# Patient Record
Sex: Male | Born: 1960
Health system: Southern US, Community
[De-identification: ages and names within clinical notes are randomized; demographics above are authoritative.]

## PROBLEM LIST (undated history)

## (undated) DIAGNOSIS — G4730 Sleep apnea, unspecified: Secondary | ICD-10-CM

## (undated) DIAGNOSIS — Z86010 Personal history of colonic polyps: Secondary | ICD-10-CM

## (undated) DIAGNOSIS — H269 Unspecified cataract: Secondary | ICD-10-CM

## (undated) DIAGNOSIS — J439 Emphysema, unspecified: Secondary | ICD-10-CM

## (undated) DIAGNOSIS — E785 Hyperlipidemia, unspecified: Secondary | ICD-10-CM

## (undated) HISTORY — DX: Emphysema, unspecified: J43.9

## (undated) HISTORY — DX: Hyperlipidemia, unspecified: E78.5

## (undated) HISTORY — DX: Sleep apnea, unspecified: G47.30

## (undated) HISTORY — PX: COLONOSCOPY: SHX174

## (undated) HISTORY — DX: Unspecified cataract: H26.9

## (undated) HISTORY — DX: Personal history of colonic polyps: Z86.010

---

## 2001-10-28 ENCOUNTER — Encounter: Payer: Self-pay | Admitting: Emergency Medicine

## 2001-10-28 ENCOUNTER — Encounter: Admission: RE | Admit: 2001-10-28 | Discharge: 2001-10-28 | Payer: Self-pay | Admitting: Emergency Medicine

## 2004-11-10 ENCOUNTER — Encounter: Admission: RE | Admit: 2004-11-10 | Discharge: 2004-11-10 | Payer: Self-pay | Admitting: Emergency Medicine

## 2012-02-14 ENCOUNTER — Encounter: Payer: Self-pay | Admitting: Internal Medicine

## 2012-03-28 ENCOUNTER — Ambulatory Visit (AMBULATORY_SURGERY_CENTER): Payer: BC Managed Care – PPO | Admitting: *Deleted

## 2012-03-28 ENCOUNTER — Encounter: Payer: Self-pay | Admitting: Internal Medicine

## 2012-03-28 VITALS — Ht 73.0 in | Wt 160.0 lb

## 2012-03-28 DIAGNOSIS — Z1211 Encounter for screening for malignant neoplasm of colon: Secondary | ICD-10-CM

## 2012-03-28 MED ORDER — SUPREP BOWEL PREP KIT 17.5-3.13-1.6 GM/177ML PO SOLN: ORAL | Status: DC

## 2012-04-11 ENCOUNTER — Encounter: Payer: Self-pay | Admitting: Internal Medicine

## 2012-04-11 ENCOUNTER — Ambulatory Visit (AMBULATORY_SURGERY_CENTER): Payer: BC Managed Care – PPO | Admitting: Internal Medicine

## 2012-04-11 VITALS — BP 123/77 | HR 46 | Temp 98.0°F | Resp 12 | Ht 73.0 in | Wt 160.0 lb

## 2012-04-11 DIAGNOSIS — D128 Benign neoplasm of rectum: Secondary | ICD-10-CM

## 2012-04-11 DIAGNOSIS — D126 Benign neoplasm of colon, unspecified: Secondary | ICD-10-CM

## 2012-04-11 DIAGNOSIS — Z1211 Encounter for screening for malignant neoplasm of colon: Secondary | ICD-10-CM

## 2012-04-11 DIAGNOSIS — D129 Benign neoplasm of anus and anal canal: Secondary | ICD-10-CM

## 2012-04-11 MED ORDER — SODIUM CHLORIDE 0.9 % IV SOLN: 500.0000 mL | INTRAVENOUS | Status: DC

## 2012-04-11 NOTE — Progress Notes (Signed)
The pt tolerated the colonoscopy very well. Maw   

## 2012-04-11 NOTE — Op Note (Signed)
[facility] [address] [place], [address]   COLONOSCOPY PROCEDURE REPORT  PATIENT: Travis, Woods  MR#: [id number] BIRTHDATE: 1961/04/20 , 51  yrs. old GENDER: Male ENDOSCOPIST: Iva Boop, MD, [facility] REFERRED XB:[id number] Felipa Eth, M.D. PROCEDURE DATE:  04/11/2012 PROCEDURE:   Colonoscopy with snare polypectomy ASA CLASS:   Class I INDICATIONS:average risk screening. MEDICATIONS: Propofol (Diprivan) 220 mg IV, MAC sedation, administered by CRNA, and These medications were titrated to patient response per physician's verbal order  DESCRIPTION OF PROCEDURE:   After the risks benefits and alternatives of the procedure were thoroughly explained, informed consent was obtained.  A digital rectal exam revealed no abnormalities of the rectum and A digital rectal exam revealed the prostate was not enlarged.   The LB [id number]  endoscope was introduced through the anus and advanced to the cecum, which was identified by both the appendix and ileocecal valve. No adverse events experienced.   The quality of the prep was Suprep excellent The instrument was then slowly withdrawn as the colon was fully examined.      COLON FINDINGS: Three polypoid shaped sessile polyps measuring 4-12 mm in size were found in the descending colon, sigmoid colon, and rectum.  A polypectomy was performed with a cold snare and using snare cautery.  The resection was complete and the polyp tissue was completely retrieved.   The colon mucosa was otherwise normal. Retroflexed views revealed no abnormalities. The time to cecum=7 minutes 19 seconds.  Withdrawal time=13 minutes 56 seconds.  The scope was withdrawn and the procedure completed. COMPLICATIONS: There were no complications.  ENDOSCOPIC IMPRESSION: 1.   Three sessile polyps measuring 4-12 mm in size were found in the descending colon, sigmoid colon, and rectum; polypectomy was performed with a cold snare and using snare  cautery 2.   The colon mucosa was otherwise normal with excellent prep  RECOMMENDATIONS: 1.  Hold aspirin, aspirin products, and anti-inflammatory medication for 2 weeks. 2.  Timing of repeat colonoscopy will be determined by pathology findings.   eSigned:  Iva Boop, MD, [facility] 04/11/2012 9:26 AM cc: Chilton Greathouse, MD and The Patient

## 2012-04-11 NOTE — Progress Notes (Signed)
Patient did not experience any of the following events: a burn prior to discharge; a fall within the facility; wrong site/side/patient/procedure/implant event; or a hospital transfer or hospital admission upon discharge from the facility. (G8907) Patient did not have preoperative order for IV antibiotic SSI prophylaxis. (G8918)  

## 2012-04-11 NOTE — Patient Instructions (Addendum)
Three polyps were removed. They look benign.  I will let you know about the pathology results and recommendation for next routine colonoscopy. I think it will be to have one in 3 years.  Thank you for choosing me and [facility].  Iva Boop, MD, FACG  YOU HAD AN ENDOSCOPIC PROCEDURE TODAY AT THE [facility]: Refer to the procedure report that was given to you for any specific questions about what was found during the examination.  If the procedure report does not answer your questions, please call your gastroenterologist to clarify.  If you requested that your care partner not be given the details of your procedure findings, then the procedure report has been included in a sealed envelope for you to review at your convenience later.  YOU SHOULD EXPECT: Some feelings of bloating in the abdomen. Passage of more gas than usual.  Walking can help get rid of the air that was put into your GI tract during the procedure and reduce the bloating. If you had a lower endoscopy (such as a colonoscopy or flexible sigmoidoscopy) you may notice spotting of blood in your stool or on the toilet paper. If you underwent a bowel prep for your procedure, then you may not have a normal bowel movement for a few days.  DIET: Your first meal following the procedure should be a light meal and then it is ok to progress to your normal diet.  A half-sandwich or bowl of soup is an example of a good first meal.  Heavy or fried foods are harder to digest and may make you feel nauseous or bloated.  Likewise meals heavy in dairy and vegetables can cause extra gas to form and this can also increase the bloating.  Drink plenty of fluids but you should avoid alcoholic beverages for 24 hours.  ACTIVITY: Your care partner should take you home directly after the procedure.  You should plan to take it easy, moving slowly for the rest of the day.  You can resume normal activity the day after the procedure  however you should NOT DRIVE or use heavy machinery for 24 hours (because of the sedation medicines used during the test).    SYMPTOMS TO REPORT IMMEDIATELY: A gastroenterologist can be reached at any hour.  During normal business hours, 8:30 AM to 5:00 PM Monday through Friday, call [phone].  After hours and on weekends, please call the GI answering service at [phone] who will take a message and have the physician on call contact you.   Following lower endoscopy (colonoscopy or flexible sigmoidoscopy):  Excessive amounts of blood in the stool  Significant tenderness or worsening of abdominal pains  Swelling of the abdomen that is new, acute  Fever of 100F or higher  FOLLOW UP: If any biopsies were taken you will be contacted by phone or by letter within the next 1-3 weeks.  Call your gastroenterologist if you have not heard about the biopsies in 3 weeks.  Our staff will call the home number listed on your records the next business day following your procedure to check on you and address any questions or concerns that you may have at that time regarding the information given to you following your procedure. This is a courtesy call and so if there is no answer at the home number and we have not heard from you through the emergency physician on call, we will assume that you have returned to your regular daily activities without incident.  SIGNATURES/CONFIDENTIALITY: You and/or your care partner have signed paperwork which will be entered into your electronic medical record.  These signatures attest to the fact that that the information above on your After Visit Summary has been reviewed and is understood.  Full responsibility of the confidentiality of this discharge information lies with you and/or your care-partner.    Handouts on polyps  Hold aspirin, aspirin containing products, and all anti inflammatory medicines for 2 weeks-no motrin, aleve, advil for 2 weeks  Tylenol only for  pain May resume these medicines 04-25-2012

## 2012-04-14 ENCOUNTER — Telehealth: Payer: Self-pay | Admitting: *Deleted

## 2012-04-14 NOTE — Telephone Encounter (Signed)
  Follow up Call-  Call back number 04/11/2012  Post procedure Call Back phone  # [phone]  Permission to leave phone message Yes     Patient questions:  Do you have a fever, pain , or abdominal swelling? no Pain Score  0 *  Have you tolerated food without any problems? yes  Have you been able to return to your normal activities? yes  Do you have any questions about your discharge instructions: Diet   no Medications  no Follow up visit  no  Do you have questions or concerns about your Care? no  Actions: * If pain score is 4 or above: No action needed, pain <4.

## 2012-04-16 ENCOUNTER — Encounter: Payer: Self-pay | Admitting: Internal Medicine

## 2012-04-16 DIAGNOSIS — Z860100 Personal history of colon polyps, unspecified: Secondary | ICD-10-CM | POA: Insufficient documentation

## 2012-04-16 HISTORY — DX: Personal history of colonic polyps: Z86.010

## 2012-04-16 NOTE — Progress Notes (Signed)
Quick Note:  2 of 3 polyps adenomas, 1 hyperplastic Repeat colon 04/2015 ______

## 2015-05-30 ENCOUNTER — Encounter: Payer: Self-pay | Admitting: Internal Medicine

## 2015-08-12 ENCOUNTER — Ambulatory Visit (AMBULATORY_SURGERY_CENTER): Payer: Self-pay | Admitting: *Deleted

## 2015-08-12 VITALS — Ht 72.0 in | Wt 174.4 lb

## 2015-08-12 DIAGNOSIS — Z86010 Personal history of colonic polyps: Secondary | ICD-10-CM

## 2015-08-12 NOTE — Progress Notes (Signed)
No allergies to eggs or soy. No prior anesthesia; no problems with sedation with previous colonoscopy  Pt given Emmi instructions for colonoscopy  No oxygen use  No diet drug use

## 2015-08-26 ENCOUNTER — Encounter: Payer: Self-pay | Admitting: Internal Medicine

## 2015-08-26 ENCOUNTER — Ambulatory Visit (AMBULATORY_SURGERY_CENTER): Payer: BLUE CROSS/BLUE SHIELD | Admitting: Internal Medicine

## 2015-08-26 VITALS — BP 116/81 | HR 61 | Temp 95.3°F | Resp 17 | Ht 72.0 in | Wt 174.0 lb

## 2015-08-26 DIAGNOSIS — Z86010 Personal history of colonic polyps: Secondary | ICD-10-CM

## 2015-08-26 DIAGNOSIS — D125 Benign neoplasm of sigmoid colon: Secondary | ICD-10-CM | POA: Diagnosis not present

## 2015-08-26 DIAGNOSIS — K621 Rectal polyp: Secondary | ICD-10-CM

## 2015-08-26 DIAGNOSIS — M329 Systemic lupus erythematosus, unspecified: Secondary | ICD-10-CM | POA: Insufficient documentation

## 2015-08-26 MED ORDER — SODIUM CHLORIDE 0.9 % IV SOLN: 500.0000 mL | INTRAVENOUS | Status: DC

## 2015-08-26 NOTE — Op Note (Signed)
[facility]  [address] [place], [address]   COLONOSCOPY PROCEDURE REPORT  PATIENT: Travis Woods, Travis Woods  MR#: [id number] BIRTHDATE: Sep 04, 1960 , 17  yrs. old GENDER: male ENDOSCOPIST: Gatha Mayer, MD, [facility] PROCEDURE DATE:  08/26/2015 PROCEDURE:   Colonoscopy, surveillance and Colonoscopy with snare polypectomy First Screening Colonoscopy - Avg.  risk and is 50 yrs.  old or older - No.  Prior Negative Screening - Now for repeat screening. N/A  History of Adenoma - Now for follow-up colonoscopy & has been > or = to 3 yrs.  Yes hx of adenoma.  Has been 3 or more years since last colonoscopy.  Polyps removed today? Yes ASA CLASS:   Class II INDICATIONS:Surveillance due to prior colonic neoplasia and PH Colon Adenoma. MEDICATIONS: Propofol 300 mg IV, Monitored anesthesia care, and Lidocaine 40 mg IV  DESCRIPTION OF PROCEDURE:   After the risks benefits and alternatives of the procedure were thoroughly explained, informed consent was obtained.  The digital rectal exam revealed no abnormalities of the rectum, revealed no prostatic nodules, and revealed the prostate was not enlarged.   The LB [id number] endoscope was introduced through the anus and advanced to the cecum, which was identified by both the appendix and ileocecal valve. No adverse events experienced.   The quality of the prep was excellent.  (MiraLax was used)  The instrument was then slowly withdrawn as the colon was fully examined. Estimated blood loss is zero unless otherwise noted in this procedure report.      COLON FINDINGS: A sessile polyp measuring 3 mm in size was found in the sigmoid colon.  A polypectomy was performed with a cold snare. The resection was complete but the polyp tissue was not retrieved. The examination was otherwise normal.  Retroflexed views revealed no abnormalities. The time to cecum = 7.4 Withdrawal time = 9.1 The scope was withdrawn and the procedure  completed. COMPLICATIONS: There were no immediate complications.  ENDOSCOPIC IMPRESSION: 1.   Sessile polyp was found in the sigmoid colon; polypectomy was performed with a cold snare 2.   The examination was otherwise normal - excellent prep  RECOMMENDATIONS: Repeat Colonoscopy in 5 years.  2022 - he has hx adenomas x 2 max 12 mm 2013  eSigned:  Gatha Mayer, MD, [facility] 08/26/2015 10:58 AM   cc: Dr. Berneta Sages and The Patient

## 2015-08-26 NOTE — Patient Instructions (Addendum)
I removed one tiny polyp - it was not collected but it was tiny and I feel certain it was benign.  Your next routine colonoscopy should be in 5 years - 2022.  I appreciate the opportunity to care for you. Gatha Mayer, MD, FACG  YOU HAD AN ENDOSCOPIC PROCEDURE TODAY AT [facility]:   Refer to the procedure report that was given to you for any specific questions about what was found during the examination.  If the procedure report does not answer your questions, please call your gastroenterologist to clarify.  If you requested that your care partner not be given the details of your procedure findings, then the procedure report has been included in a sealed envelope for you to review at your convenience later.  YOU SHOULD EXPECT: Some feelings of bloating in the abdomen. Passage of more gas than usual.  Walking can help get rid of the air that was put into your GI tract during the procedure and reduce the bloating. If you had a lower endoscopy (such as a colonoscopy or flexible sigmoidoscopy) you may notice spotting of blood in your stool or on the toilet paper. If you underwent a bowel prep for your procedure, you may not have a normal bowel movement for a few days.  Please Note:  You might notice some irritation and congestion in your nose or some drainage.  This is from the oxygen used during your procedure.  There is no need for concern and it should clear up in a day or so.  SYMPTOMS TO REPORT IMMEDIATELY:   Following lower endoscopy (colonoscopy or flexible sigmoidoscopy):  Excessive amounts of blood in the stool  Significant tenderness or worsening of abdominal pains  Swelling of the abdomen that is new, acute  Fever of 100F or higher  For urgent or emergent issues, a gastroenterologist can be reached at any hour by calling [phone].   DIET: Your first meal following the procedure should be a small meal and then it is ok to progress to your normal  diet. Heavy or fried foods are harder to digest and may make you feel nauseous or bloated.  Likewise, meals heavy in dairy and vegetables can increase bloating.  Drink plenty of fluids but you should avoid alcoholic beverages for 24 hours.  ACTIVITY:  You should plan to take it easy for the rest of today and you should NOT DRIVE or use heavy machinery until tomorrow (because of the sedation medicines used during the test).    FOLLOW UP: Our staff will call the number listed on your records the next business day following your procedure to check on you and address any questions or concerns that you may have regarding the information given to you following your procedure. If we do not reach you, we will leave a message.  However, if you are feeling well and you are not experiencing any problems, there is no need to return our call.  We will assume that you have returned to your regular daily activities without incident.  If any biopsies were taken you will be contacted by phone or by letter within the next 1-3 weeks.  Please call us at [phone] if you have not heard about the biopsies in 3 weeks.    SIGNATURES/CONFIDENTIALITY: You and/or your care partner have signed paperwork which will be entered into your electronic medical record.  These signatures attest to the fact that that the information above on your  After Visit Summary has been reviewed and is understood.  Full responsibility of the confidentiality of this discharge information lies with you and/or your care-partner.

## 2015-08-26 NOTE — Progress Notes (Signed)
Stable to RR 

## 2015-08-26 NOTE — Progress Notes (Signed)
Called to room to assist during endoscopic procedure.  Patient ID and intended procedure confirmed with present staff. Received instructions for my participation in the procedure from the performing physician.  

## 2015-08-29 ENCOUNTER — Telehealth: Payer: Self-pay | Admitting: *Deleted

## 2015-08-29 NOTE — Telephone Encounter (Signed)
No answer. No identifier. Message left to call if questions or concerns. 

## 2015-11-10 DIAGNOSIS — Z125 Encounter for screening for malignant neoplasm of prostate: Secondary | ICD-10-CM | POA: Diagnosis not present

## 2015-11-10 DIAGNOSIS — Z0000 Encounter for general adult medical examination without abnormal findings: Secondary | ICD-10-CM | POA: Diagnosis not present

## 2015-11-15 DIAGNOSIS — M546 Pain in thoracic spine: Secondary | ICD-10-CM | POA: Diagnosis not present

## 2015-11-15 DIAGNOSIS — L930 Discoid lupus erythematosus: Secondary | ICD-10-CM | POA: Diagnosis not present

## 2015-11-15 DIAGNOSIS — Z125 Encounter for screening for malignant neoplasm of prostate: Secondary | ICD-10-CM | POA: Diagnosis not present

## 2015-11-15 DIAGNOSIS — E784 Other hyperlipidemia: Secondary | ICD-10-CM | POA: Diagnosis not present

## 2015-11-15 DIAGNOSIS — Z0000 Encounter for general adult medical examination without abnormal findings: Secondary | ICD-10-CM | POA: Diagnosis not present

## 2015-11-15 DIAGNOSIS — K635 Polyp of colon: Secondary | ICD-10-CM | POA: Diagnosis not present

## 2015-11-15 DIAGNOSIS — Z1389 Encounter for screening for other disorder: Secondary | ICD-10-CM | POA: Diagnosis not present

## 2015-11-17 DIAGNOSIS — Z1212 Encounter for screening for malignant neoplasm of rectum: Secondary | ICD-10-CM | POA: Diagnosis not present

## 2016-11-22 DIAGNOSIS — E784 Other hyperlipidemia: Secondary | ICD-10-CM | POA: Diagnosis not present

## 2016-11-22 DIAGNOSIS — Z125 Encounter for screening for malignant neoplasm of prostate: Secondary | ICD-10-CM | POA: Diagnosis not present

## 2016-11-27 DIAGNOSIS — Z0000 Encounter for general adult medical examination without abnormal findings: Secondary | ICD-10-CM | POA: Diagnosis not present

## 2016-11-27 DIAGNOSIS — M546 Pain in thoracic spine: Secondary | ICD-10-CM | POA: Diagnosis not present

## 2016-11-27 DIAGNOSIS — L930 Discoid lupus erythematosus: Secondary | ICD-10-CM | POA: Diagnosis not present

## 2016-11-27 DIAGNOSIS — K635 Polyp of colon: Secondary | ICD-10-CM | POA: Diagnosis not present

## 2016-11-27 DIAGNOSIS — F17200 Nicotine dependence, unspecified, uncomplicated: Secondary | ICD-10-CM | POA: Diagnosis not present

## 2016-12-05 DIAGNOSIS — F17200 Nicotine dependence, unspecified, uncomplicated: Secondary | ICD-10-CM | POA: Diagnosis not present

## 2016-12-05 DIAGNOSIS — J209 Acute bronchitis, unspecified: Secondary | ICD-10-CM | POA: Diagnosis not present

## 2016-12-05 DIAGNOSIS — R05 Cough: Secondary | ICD-10-CM | POA: Diagnosis not present

## 2016-12-05 DIAGNOSIS — Z6823 Body mass index (BMI) 23.0-23.9, adult: Secondary | ICD-10-CM | POA: Diagnosis not present

## 2017-12-25 DIAGNOSIS — E7849 Other hyperlipidemia: Secondary | ICD-10-CM | POA: Diagnosis not present

## 2017-12-25 DIAGNOSIS — Z125 Encounter for screening for malignant neoplasm of prostate: Secondary | ICD-10-CM | POA: Diagnosis not present

## 2018-01-01 DIAGNOSIS — Z0000 Encounter for general adult medical examination without abnormal findings: Secondary | ICD-10-CM | POA: Diagnosis not present

## 2018-01-01 DIAGNOSIS — K635 Polyp of colon: Secondary | ICD-10-CM | POA: Diagnosis not present

## 2018-01-01 DIAGNOSIS — E7849 Other hyperlipidemia: Secondary | ICD-10-CM | POA: Diagnosis not present

## 2018-01-01 DIAGNOSIS — Z125 Encounter for screening for malignant neoplasm of prostate: Secondary | ICD-10-CM | POA: Diagnosis not present

## 2018-01-01 DIAGNOSIS — Z1389 Encounter for screening for other disorder: Secondary | ICD-10-CM | POA: Diagnosis not present

## 2018-01-01 DIAGNOSIS — F17200 Nicotine dependence, unspecified, uncomplicated: Secondary | ICD-10-CM | POA: Diagnosis not present

## 2018-01-01 DIAGNOSIS — H6123 Impacted cerumen, bilateral: Secondary | ICD-10-CM | POA: Diagnosis not present

## 2018-01-06 DIAGNOSIS — Z1212 Encounter for screening for malignant neoplasm of rectum: Secondary | ICD-10-CM | POA: Diagnosis not present

## 2018-04-04 DIAGNOSIS — Z6823 Body mass index (BMI) 23.0-23.9, adult: Secondary | ICD-10-CM | POA: Diagnosis not present

## 2018-04-04 DIAGNOSIS — R222 Localized swelling, mass and lump, trunk: Secondary | ICD-10-CM | POA: Diagnosis not present

## 2018-04-04 DIAGNOSIS — F17200 Nicotine dependence, unspecified, uncomplicated: Secondary | ICD-10-CM | POA: Diagnosis not present

## 2018-04-23 DIAGNOSIS — Z6823 Body mass index (BMI) 23.0-23.9, adult: Secondary | ICD-10-CM | POA: Diagnosis not present

## 2018-04-23 DIAGNOSIS — L0889 Other specified local infections of the skin and subcutaneous tissue: Secondary | ICD-10-CM | POA: Diagnosis not present

## 2018-04-23 DIAGNOSIS — F17200 Nicotine dependence, unspecified, uncomplicated: Secondary | ICD-10-CM | POA: Diagnosis not present

## 2018-06-01 ENCOUNTER — Encounter (HOSPITAL_COMMUNITY): Payer: Self-pay | Admitting: Emergency Medicine

## 2018-06-01 ENCOUNTER — Emergency Department (HOSPITAL_COMMUNITY): Payer: BLUE CROSS/BLUE SHIELD

## 2018-06-01 ENCOUNTER — Encounter (HOSPITAL_COMMUNITY): Payer: Self-pay | Admitting: *Deleted

## 2018-06-01 ENCOUNTER — Ambulatory Visit (HOSPITAL_COMMUNITY)
Admission: EM | Admit: 2018-06-01 | Discharge: 2018-06-01 | Disposition: A | Discharge status: Home / Self Care | Payer: BLUE CROSS/BLUE SHIELD | Attending: Family Medicine | Admitting: Family Medicine

## 2018-06-01 ENCOUNTER — Emergency Department (HOSPITAL_COMMUNITY)
Admission: EM | Admit: 2018-06-01 | Discharge: 2018-06-01 | Disposition: A | Discharge status: Home / Self Care | Payer: BLUE CROSS/BLUE SHIELD | Attending: Emergency Medicine | Admitting: Emergency Medicine

## 2018-06-01 ENCOUNTER — Ambulatory Visit (INDEPENDENT_AMBULATORY_CARE_PROVIDER_SITE_OTHER): Payer: BLUE CROSS/BLUE SHIELD

## 2018-06-01 DIAGNOSIS — M546 Pain in thoracic spine: Secondary | ICD-10-CM

## 2018-06-01 DIAGNOSIS — R918 Other nonspecific abnormal finding of lung field: Secondary | ICD-10-CM | POA: Diagnosis not present

## 2018-06-01 DIAGNOSIS — R0600 Dyspnea, unspecified: Secondary | ICD-10-CM | POA: Diagnosis not present

## 2018-06-01 DIAGNOSIS — R079 Chest pain, unspecified: Secondary | ICD-10-CM

## 2018-06-01 DIAGNOSIS — F17210 Nicotine dependence, cigarettes, uncomplicated: Secondary | ICD-10-CM | POA: Diagnosis not present

## 2018-06-01 DIAGNOSIS — R0781 Pleurodynia: Secondary | ICD-10-CM | POA: Diagnosis not present

## 2018-06-01 DIAGNOSIS — R7989 Other specified abnormal findings of blood chemistry: Secondary | ICD-10-CM | POA: Diagnosis not present

## 2018-06-01 DIAGNOSIS — Z79899 Other long term (current) drug therapy: Secondary | ICD-10-CM | POA: Diagnosis not present

## 2018-06-01 DIAGNOSIS — R52 Pain, unspecified: Secondary | ICD-10-CM

## 2018-06-01 LAB — D-DIMER, QUANTITATIVE (NOT AT ARMC): D DIMER QUANT: 0.45 ug{FEU}/mL (ref 0.00–0.50)

## 2018-06-01 LAB — CBC WITH DIFFERENTIAL/PLATELET
Abs Immature Granulocytes: 0.02 10*3/uL (ref 0.00–0.07)
Basophils Absolute: 0 10*3/uL (ref 0.0–0.1)
Basophils Relative: 1 %
Eosinophils Absolute: 0.2 10*3/uL (ref 0.0–0.5)
Eosinophils Relative: 3 %
HEMATOCRIT: 44.7 % (ref 39.0–52.0)
Hemoglobin: 14.3 g/dL (ref 13.0–17.0)
Immature Granulocytes: 0 %
Lymphocytes Relative: 34 %
Lymphs Abs: 2 10*3/uL (ref 0.7–4.0)
MCH: 28.4 pg (ref 26.0–34.0)
MCHC: 32 g/dL (ref 30.0–36.0)
MCV: 88.7 fL (ref 80.0–100.0)
Monocytes Absolute: 0.4 10*3/uL (ref 0.1–1.0)
Monocytes Relative: 7 %
NEUTROS ABS: 3.2 10*3/uL (ref 1.7–7.7)
Neutrophils Relative %: 55 %
Platelets: 185 10*3/uL (ref 150–400)
RBC: 5.04 MIL/uL (ref 4.22–5.81)
RDW: 15.3 % (ref 11.5–15.5)
WBC: 5.8 10*3/uL (ref 4.0–10.5)
nRBC: 0 % (ref 0.0–0.2)

## 2018-06-01 LAB — BASIC METABOLIC PANEL
Anion gap: 8 (ref 5–15)
BUN: 13 mg/dL (ref 6–20)
CO2: 25 mmol/L (ref 22–32)
Calcium: 9.4 mg/dL (ref 8.9–10.3)
Chloride: 104 mmol/L (ref 98–111)
Creatinine, Ser: 0.89 mg/dL (ref 0.61–1.24)
GFR calc Af Amer: >60 mL/min (ref >60)
GFR calc non Af Amer: >60 mL/min (ref >60)
Glucose, Bld: 93 mg/dL (ref 70–99)
POTASSIUM: 3.9 mmol/L (ref 3.5–5.1)
Sodium: 137 mmol/L (ref 135–145)

## 2018-06-01 LAB — I-STAT TROPONIN, ED: Troponin i, poc: 0.01 ng/mL (ref 0.00–0.08)

## 2018-06-01 MED ORDER — IBUPROFEN 400 MG PO TABS
600.0000 mg | ORAL_TABLET | Freq: Once | ORAL | Status: AC
  Administered 2018-06-01: 600 mg via ORAL
  Filled 2018-06-01: qty 1

## 2018-06-01 MED ORDER — HYDROCODONE-ACETAMINOPHEN 5-325 MG PO TABS: 1.0000 | ORAL_TABLET | Freq: Four times a day (QID) | ORAL | 0 refills | Status: AC | PRN

## 2018-06-01 MED ORDER — IOPAMIDOL (ISOVUE-370) INJECTION 76%
INTRAVENOUS | Status: AC
  Filled 2018-06-01: qty 100

## 2018-06-01 MED ORDER — IOPAMIDOL (ISOVUE-370) INJECTION 76%
100.0000 mL | Freq: Once | INTRAVENOUS | Status: AC | PRN
  Administered 2018-06-01: 100 mL via INTRAVENOUS

## 2018-06-01 MED ORDER — METHOCARBAMOL 500 MG PO TABS
1000.0000 mg | ORAL_TABLET | Freq: Once | ORAL | Status: AC
  Administered 2018-06-01: 1000 mg via ORAL
  Filled 2018-06-01: qty 2

## 2018-06-01 MED ORDER — LIDOCAINE 5 % EX PTCH: 1.0000 | MEDICATED_PATCH | CUTANEOUS | 0 refills | Status: AC

## 2018-06-01 MED ORDER — METHOCARBAMOL 500 MG PO TABS: 500.0000 mg | ORAL_TABLET | Freq: Two times a day (BID) | ORAL | 0 refills | Status: AC

## 2018-06-01 NOTE — Discharge Instructions (Addendum)
I would like for you to go to the ER for further evaluation of your chest pain

## 2018-06-01 NOTE — ED Triage Notes (Signed)
Denies injury.  C/O left lateral mid-ribcage tenderness, pain with deep breathing and movement.

## 2018-06-01 NOTE — ED Provider Notes (Signed)
[facility] EMERGENCY DEPARTMENT Provider Note   CSN: [id number] Arrival date & time: 06/01/18  1353     History   Chief Complaint Chief Complaint  Patient presents with  . Chest Pain    HPI Travis Woods is a 57 y.o. male.  HPI   Travis Woods is a 57 y.o. male, with a history of cutaneous lupus, presenting to the ED with left upper back pain beginning this morning upon waking.  Pain has been worsening throughout the day and intensifies with deep breathing, "feels like someone is sticking a knife in me," 7/10, radiating toward the left axillary region. Patient was seen at urgent care and then sent to the ED due to concern for EKG abnormalities. Denies cough, chest pain, abdominal pain, trauma, diaphoresis, dizziness, N/V/D, lower extremity edema/pain, or any other complaints.   Past Medical History:  Diagnosis Date  . Personal history of adenomatous colonic polyps 04/16/2012   04/2012 - 3 polyps, 2 adenomas largest 12 mm and 1 hyperplastic     Patient Active Problem List   Diagnosis Date Noted  . Systemic lupus erythematosus ([place]) 08/26/2015  . Personal history of adenomatous colonic polyps 04/16/2012    Past Surgical History:  Procedure Laterality Date  . COLONOSCOPY          Home Medications    Prior to Admission medications   Medication Sig Start Date End Date Taking? Authorizing Provider  ibuprofen (ADVIL,MOTRIN) 200 MG tablet Take 400 mg by mouth every 6 (six) hours as needed for moderate pain.   Yes [provider]  Varenicline Tartrate (CHANTIX PO) Take by mouth.   Yes [provider]  HYDROcodone-acetaminophen (NORCO/VICODIN) 5-325 MG tablet Take 1-2 tablets by mouth every 6 (six) hours as needed for severe pain. 06/01/18   Caya Soberanis C, PA-C  lidocaine (LIDODERM) 5 % Place 1 patch onto the skin daily. Remove & Discard patch within 12 hours or as directed by MD 06/01/18   Mirca Yale C, PA-C  methocarbamol (ROBAXIN) 500  MG tablet Take 1 tablet (500 mg total) by mouth 2 (two) times daily. 06/01/18   Aliany Fiorenza, Helane Gunther, PA-C    Family History Family History  Problem Relation Age of Onset  . Prostate cancer Father   . Colon cancer Neg Hx     Social History Social History   Tobacco Use  . Smoking status: Current Every Day Smoker    Types: Cigarettes  . Smokeless tobacco: Never Used  Substance Use Topics  . Alcohol use: No    Alcohol/week: 0.0 standard drinks  . Drug use: Never     Allergies   Patient has no known allergies.   Review of Systems Review of Systems  Constitutional: Negative for chills, diaphoresis and fever.  Respiratory: Negative for shortness of breath.   Cardiovascular: Negative for chest pain and leg swelling.  Gastrointestinal: Negative for abdominal pain, diarrhea, nausea and vomiting.  Musculoskeletal: Positive for back pain. Negative for neck pain.  Neurological: Negative for weakness and numbness.  All other systems reviewed and are negative.    Physical Exam Updated Vital Signs BP 134/87 (BP Location: Right Arm)   Pulse 70   Temp 97.6 F (36.4 C) (Oral)   Resp (!) 22   Ht 6' (1.829 m)   Wt 78.9 kg   SpO2 99%   BMI 23.59 kg/m   Physical Exam  Constitutional: He appears well-developed and well-nourished. No distress.  HENT:  Head: Normocephalic and atraumatic.  Eyes: Conjunctivae are normal.  Neck: Neck supple.  Cardiovascular: Normal rate, regular rhythm, normal heart sounds and intact distal pulses.  Pulmonary/Chest: Effort normal and breath sounds normal. No respiratory distress.  Abdominal: Soft. There is no tenderness. There is no guarding.  Musculoskeletal: He exhibits no edema.       Back:  Tenderness in the region indicated, however, patient states tenderness is only present with deep palpation.  Lymphadenopathy:    He has no cervical adenopathy.  Neurological: He is alert.  Skin: Skin is warm and dry. He is not diaphoretic.  Psychiatric: He has  a normal mood and affect. His behavior is normal.  Nursing note and vitals reviewed.    ED Treatments / Results  Labs (all labs ordered are listed, but only abnormal results are displayed) Labs Reviewed  BASIC METABOLIC PANEL  CBC WITH DIFFERENTIAL/PLATELET  D-DIMER, QUANTITATIVE (NOT AT [facility])  I-STAT TROPONIN, ED    EKG EKG Interpretation  Date/Time:  Sunday June 01 2018 14:02:31 EST Ventricular Rate:  63 PR Interval:    QRS Duration: 139 QT Interval:  410 QTC Calculation: 420 R Axis:   45 Text Interpretation:  Sinus rhythm Ventricular premature complex Borderline prolonged PR interval Nonspecific intraventricular conduction delay Minimal ST elevation, inferior leads Baseline wander in lead(s) V2 No significant change since last tracing Confirmed by Orlie Dakin [phone]) on 06/01/2018 2:12:02 PM   Radiology Dg Chest 1 View  Result Date: 06/01/2018 CLINICAL DATA:  Cough with pleurisy and mid ribcage tenderness. EXAM: CHEST  1 VIEW COMPARISON:  None. FINDINGS: Within the posterior costophrenic sulcus there and indeterminate opacity overlying the posterior elements of the approximate T11 vertebra. Indeterminate. IMPRESSION: 1. Indeterminate opacity in the projection of the lower thoracic vertebra. In a patient that is at increased risk for lung cancer and presents with chronic cough consider further evaluation with CT of the chest. Electronically Signed   By: Kerby Moors M.D.   On: 06/01/2018 15:41   Dg Ribs Unilateral W/chest Left  Result Date: 06/01/2018 CLINICAL DATA:  Hervey Ard sudden onset upper left back/left rib pain with dyspnea. Current smoker. No reported injury. EXAM: LEFT RIBS AND CHEST - 3+ VIEW COMPARISON:  None. FINDINGS: Top-normal heart size. Normal mediastinal contour. No pneumothorax. No pleural effusion. Lungs appear clear, with no acute consolidative airspace disease and no pulmonary edema. No evidence of fracture or focal osseous lesion in the left ribs,  noting nonvisualization of small portions of the lower most ribs on these views. IMPRESSION: No active cardiopulmonary disease. No fracture or focal osseous lesion in the left ribs, see comments for limitations. Electronically Signed   By: Ilona Sorrel M.D.   On: 06/01/2018 13:35   Ct Angio Chest Pe W And/or Wo Contrast  Result Date: 06/01/2018 CLINICAL DATA:  Back pain. Chest pain and abnormal chest x-ray. Evaluate for pulmonary embolism. Positive D-dimer. EXAM: CT ANGIOGRAPHY CHEST WITH CONTRAST TECHNIQUE: Multidetector CT imaging of the chest was performed using the standard protocol during bolus administration of intravenous contrast. Multiplanar CT image reconstructions and MIPs were obtained to evaluate the vascular anatomy. CONTRAST:  13mL ISOVUE-370 IOPAMIDOL (ISOVUE-370) INJECTION 76% COMPARISON:  Chest x-ray 06/01/2018 FINDINGS: Cardiovascular: Borderline cardiomegaly. Minimal calcified plaque over the left anterior descending coronary artery. Thoracic aorta is unremarkable. Pulmonary arterial system is normal without emboli. Mediastinum/Nodes: No evidence of mediastinal or hilar adenopathy. Remaining mediastinal structures are within normal. Lungs/Pleura: Lungs are well inflated without focal airspace consolidation or effusion. 2-3 mm subpleural nodule over the posterior  right upper lobe. Small amount of aspirate material over the posterior mid trachea. Remaining airways are unremarkable. Upper Abdomen: No acute findings. Musculoskeletal: Mild degenerative change of the spine. Review of the MIP images confirms the above findings. IMPRESSION: No acute cardiopulmonary disease and no evidence of pulmonary embolism. Small amount of aspirate material over the dependent portion of the mid trachea. 2-3 mm subpleural nodule over the posterolateral right upper lobe. Recommend follow-up noncontrast chest CT 1 year. This recommendation follows the consensus statement: Guidelines for Management of Small  Pulmonary Nodules Detected on CT Scans: A Statement from the [place] as published in Radiology 2005; 237:395-400. Online at: https://www.arnold.com/. Mild atherosclerotic coronary artery disease. Borderline cardiomegaly. Electronically Signed   By: Marin Olp M.D.   On: 06/01/2018 16:40    Procedures Procedures (including critical care time)  Medications Ordered in ED Medications  iopamidol (ISOVUE-370) 76 % injection (has no administration in time range)  methocarbamol (ROBAXIN) tablet 1,000 mg (1,000 mg Oral Given 06/01/18 1533)  ibuprofen (ADVIL,MOTRIN) tablet 600 mg (600 mg Oral Given 06/01/18 1533)  iopamidol (ISOVUE-370) 76 % injection 100 mL (100 mLs Intravenous Contrast Given 06/01/18 1610)     Initial Impression / Assessment and Plan / ED Course  I have reviewed the triage vital signs and the nursing notes.  Pertinent labs & imaging results that were available during my care of the patient were reviewed by me and considered in my medical decision making (see chart for details).     Patient presents with left thoracic back pain. Patient is nontoxic appearing, afebrile, not tachycardic, not tachypneic, not hypotensive, maintains excellent SPO2 on room air, and is in no apparent distress.  Troponin and d-dimer negative.  Lung nodule identified CT.  This was communicated with the patient as well as the follow-up imaging recommendations. Suspect patient pain is muscular. The patient was given instructions for home care as well as return precautions. Patient voices understanding of these instructions, accepts the plan, and is comfortable with discharge.  Vitals:   06/01/18 1500 06/01/18 1530 06/01/18 1545 06/01/18 1600  BP: 128/75 113/82 132/79 110/87  Pulse: 64 (!) 59 62 73  Resp: (!) 21 16 17  (!) 26  Temp:      TempSrc:      SpO2: 98% 98% 99% 99%  Weight:      Height:         Final Clinical Impressions(s) / ED Diagnoses   Final  diagnoses:  Acute left-sided thoracic back pain    ED Discharge Orders         Ordered    methocarbamol (ROBAXIN) 500 MG tablet  2 times daily     06/01/18 1659    lidocaine (LIDODERM) 5 %  Every 24 hours     06/01/18 1659    HYDROcodone-acetaminophen (NORCO/VICODIN) 5-325 MG tablet  Every 6 hours PRN     06/01/18 1659           Lorayne Bender, PA-C 06/01/18 1712    Orlie Dakin, MD 06/01/18 1733

## 2018-06-01 NOTE — ED Notes (Signed)
Patient transported to CT 

## 2018-06-01 NOTE — ED Provider Notes (Signed)
[facility]    CSN: [id number] Arrival date & time: 06/01/18  1158     History   Chief Complaint Chief Complaint  Patient presents with  . Chest Pain    HPI Travis Woods is a 57 y.o. male.   Patient is a 58 year old male with history of lupus that presents with sudden onset of left lateral rib pain and trouble taking a deep breath that started this morning.  He denies any heavy lifting, strenuous exercise, falls or injuries.  His symptoms have been constant and remain the same.  He has not take anything for his symptoms.  He denies any recent URI symptoms or fevers.  Patient is a current everyday smoker.  He denies any chest pain but some mild shortness of breath. Pt guarding.   ROS per HPI    Chest Pain    Past Medical History:  Diagnosis Date  . Personal history of adenomatous colonic polyps 04/16/2012   04/2012 - 3 polyps, 2 adenomas largest 12 mm and 1 hyperplastic     Patient Active Problem List   Diagnosis Date Noted  . Systemic lupus erythematosus ([place]) 08/26/2015  . Personal history of adenomatous colonic polyps 04/16/2012    Past Surgical History:  Procedure Laterality Date  . COLONOSCOPY         Home Medications    Prior to Admission medications   Medication Sig Start Date End Date Taking? Authorizing Provider  Varenicline Tartrate (CHANTIX PO) Take by mouth.   Yes [provider]    Family History Family History  Problem Relation Age of Onset  . Prostate cancer Father   . Colon cancer Neg Hx     Social History Social History   Tobacco Use  . Smoking status: Current Every Day Smoker    Types: Cigarettes  . Smokeless tobacco: Never Used  Substance Use Topics  . Alcohol use: No    Alcohol/week: 0.0 standard drinks  . Drug use: Never     Allergies   Patient has no known allergies.   Review of Systems Review of Systems  Cardiovascular: Positive for chest pain.     Physical Exam Triage Vital Signs ED  Triage Vitals  Enc Vitals Group     BP 06/01/18 1222 127/79     Pulse Rate 06/01/18 1222 61     Resp 06/01/18 1222 14     Temp 06/01/18 1222 97.6 F (36.4 C)     Temp Source 06/01/18 1222 Oral     SpO2 06/01/18 1222 100 %     Weight --      Height --      Head Circumference --      Peak Flow --      Pain Score 06/01/18 1223 7     Pain Loc --      Pain Edu? --      Excl. in [place]? --    No data found.  Updated Vital Signs BP 127/79   Pulse 61   Temp 97.6 F (36.4 C) (Oral)   Resp 14   SpO2 100%   Visual Acuity Right Eye Distance:   Left Eye Distance:   Bilateral Distance:    Right Eye Near:   Left Eye Near:    Bilateral Near:     Physical Exam  Constitutional: He appears well-developed and well-nourished.  HENT:  Head: Normocephalic.  Neck: Normal range of motion.  Cardiovascular: Normal rate, regular rhythm and normal pulses. Exam reveals no  S3 and no S4.  No murmur heard. Pulmonary/Chest: Effort normal and breath sounds normal.  Mild dyspnea  Musculoskeletal: Normal range of motion.       Right lower leg: Normal.       Left lower leg: Normal.  Mild tenderness to the left lateral rib/chest area. No swelling, bruising, ecchymosis or deformity.   Neurological: He is alert.  Skin: Skin is warm and dry.  Psychiatric: He has a normal mood and affect.  Nursing note and vitals reviewed.    UC Treatments / Results  Labs (all labs ordered are listed, but only abnormal results are displayed) Labs Reviewed - No data to display  EKG None  Radiology Dg Ribs Unilateral W/chest Left  Result Date: 06/01/2018 CLINICAL DATA:  Hervey Ard sudden onset upper left back/left rib pain with dyspnea. Current smoker. No reported injury. EXAM: LEFT RIBS AND CHEST - 3+ VIEW COMPARISON:  None. FINDINGS: Top-normal heart size. Normal mediastinal contour. No pneumothorax. No pleural effusion. Lungs appear clear, with no acute consolidative airspace disease and no pulmonary edema. No  evidence of fracture or focal osseous lesion in the left ribs, noting nonvisualization of small portions of the lower most ribs on these views. IMPRESSION: No active cardiopulmonary disease. No fracture or focal osseous lesion in the left ribs, see comments for limitations. Electronically Signed   By: Ilona Sorrel M.D.   On: 06/01/2018 13:35    Procedures Procedures (including critical care time)  Medications Ordered in UC Medications - No data to display  Initial Impression / Assessment and Plan / UC Course  I have reviewed the triage vital signs and the nursing notes.  Pertinent labs & imaging results that were available during my care of the patient were reviewed by me and considered in my medical decision making (see chart for details).     She is a 57 year old male that comes in with sudden onset of left-sided chest/rib discomfort.  Hurts to take a deep breath. Mild dyspnea The pain is somewhat reproducible He appears anxious.  Vital signs are stable patient is nontoxic EKG revealed sinus rhythm with premature atrial complexes, septal infarct, T wave abnormality There is no EKG to compare this to. Patient is a smoker X-ray not show any abnormalities Patient still very uncomfortable Based on patient appearance, history of smoking, anxious appearance, and EKG abnormalities we will send down to the ER for further evaluation and management, to rule out any ACS or PE.   Final Clinical Impressions(s) / UC Diagnoses   Final diagnoses:  Nonspecific chest pain     Discharge Instructions     I would like for you to go to the ER for further evaluation of your chest pain    ED Prescriptions    None     Controlled Substance Prescriptions [place] Controlled Substance Registry consulted? Not Applicable   Orvan July, NP 06/01/18 1346

## 2018-06-01 NOTE — Discharge Instructions (Addendum)
Take it easy, but do not lay around too much as this may make any stiffness worse.  Antiinflammatory medications: Take 600 mg of ibuprofen every 6 hours or 440 mg (over the counter dose) to 500 mg (prescription dose) of naproxen every 12 hours for the next 3 days. After this time, these medications may be used as needed for pain. Take these medications with food to avoid upset stomach. Choose only one of these medications, do not take them together. Acetaminophen (generic for Tylenol): Should you continue to have additional pain while taking the ibuprofen or naproxen, you may add in acetaminophen as needed. Your daily total maximum amount of acetaminophen from all sources should be limited to 4000mg /day for persons without liver problems, or 2000mg /day for those with liver problems. Vicodin: May take Vicodin (hydrocodone-acetaminophen) as needed for severe pain.  Do not drive or perform other dangerous activities while taking the Vicodin.  Please note that each pill of Vicodin contains 325 mg of acetaminophen (Tylenol) and the above dosage limits apply. Muscle relaxer: Robaxin is a muscle relaxer and may help loosen stiff muscles. Do not take the Robaxin while driving or performing other dangerous activities.  Lidocaine patches: These are available via either prescription or over-the-counter. The over-the-counter option may be more economical one and are likely just as effective. There are multiple over-the-counter brands, such as Salonpas. Exercises: Be sure to perform the attached exercises starting with three times a week and working up to performing them daily. This is an essential part of preventing long term problems.  Follow up: Follow up with a primary care provider for any future management of these complaints. Be sure to follow up within 7-10 days. Return: Return to the ED should symptoms worsen.  For prescription assistance, may try using prescription discount sites or apps, such as  goodrx.com  As discussed, there was a lung nodule noted on the CT scan.  Recommendation is for you to have repeat CT scan in 1 year.

## 2018-06-01 NOTE — ED Triage Notes (Signed)
To ED via private vehicle from urgent car-- c/o back pain- intermittent with inspiration "feels like someone is sticking a knife in me" pain is 7/10-

## 2018-06-01 NOTE — ED Provider Notes (Signed)
Complains of left-sided posterior lateral thoracic pain onset upon awakening this morning.  Pain is also worse with coughing or changing positions.  Improved by remaining still.  Nonexertional.  Seen at urgent care center prior to coming here sent here for further evaluation.  On exam he is in no distress lungs clear to auscultation heart regular rate and rhythm.  He has pain at posterior lateral chest when sitting up from a supine position.  Chest x-ray viewed by me   Orlie Dakin, MD 06/01/18 1723

## 2018-06-01 NOTE — ED Notes (Signed)
ED Provider at bedside. 

## 2018-09-10 DIAGNOSIS — H40013 Open angle with borderline findings, low risk, bilateral: Secondary | ICD-10-CM | POA: Diagnosis not present

## 2018-09-10 DIAGNOSIS — H11153 Pinguecula, bilateral: Secondary | ICD-10-CM | POA: Diagnosis not present

## 2018-09-10 DIAGNOSIS — H1132 Conjunctival hemorrhage, left eye: Secondary | ICD-10-CM | POA: Diagnosis not present

## 2018-09-22 DIAGNOSIS — S39012D Strain of muscle, fascia and tendon of lower back, subsequent encounter: Secondary | ICD-10-CM | POA: Diagnosis not present

## 2018-09-22 DIAGNOSIS — H159 Unspecified disorder of sclera: Secondary | ICD-10-CM | POA: Diagnosis not present

## 2018-09-22 DIAGNOSIS — H6121 Impacted cerumen, right ear: Secondary | ICD-10-CM | POA: Diagnosis not present

## 2019-01-06 DIAGNOSIS — Z0000 Encounter for general adult medical examination without abnormal findings: Secondary | ICD-10-CM | POA: Diagnosis not present

## 2019-01-06 DIAGNOSIS — E7849 Other hyperlipidemia: Secondary | ICD-10-CM | POA: Diagnosis not present

## 2019-01-06 DIAGNOSIS — Z125 Encounter for screening for malignant neoplasm of prostate: Secondary | ICD-10-CM | POA: Diagnosis not present

## 2019-01-13 DIAGNOSIS — F17200 Nicotine dependence, unspecified, uncomplicated: Secondary | ICD-10-CM | POA: Diagnosis not present

## 2019-01-13 DIAGNOSIS — K635 Polyp of colon: Secondary | ICD-10-CM | POA: Diagnosis not present

## 2019-01-13 DIAGNOSIS — E785 Hyperlipidemia, unspecified: Secondary | ICD-10-CM | POA: Diagnosis not present

## 2019-01-13 DIAGNOSIS — M545 Low back pain: Secondary | ICD-10-CM | POA: Diagnosis not present

## 2019-01-13 DIAGNOSIS — Z0000 Encounter for general adult medical examination without abnormal findings: Secondary | ICD-10-CM | POA: Diagnosis not present

## 2019-05-27 ENCOUNTER — Other Ambulatory Visit: Payer: Self-pay | Admitting: Internal Medicine

## 2019-05-27 DIAGNOSIS — R918 Other nonspecific abnormal finding of lung field: Secondary | ICD-10-CM

## 2019-06-02 DIAGNOSIS — R918 Other nonspecific abnormal finding of lung field: Secondary | ICD-10-CM | POA: Diagnosis not present

## 2019-06-02 DIAGNOSIS — F17200 Nicotine dependence, unspecified, uncomplicated: Secondary | ICD-10-CM | POA: Diagnosis not present

## 2019-06-02 DIAGNOSIS — E7849 Other hyperlipidemia: Secondary | ICD-10-CM | POA: Diagnosis not present

## 2019-07-13 ENCOUNTER — Ambulatory Visit
Admission: RE | Admit: 2019-07-13 | Discharge: 2019-07-13 | Disposition: A | Discharge status: Home / Self Care | Payer: BC Managed Care – PPO | Source: Ambulatory Visit | Attending: Internal Medicine | Admitting: Internal Medicine

## 2019-07-13 ENCOUNTER — Other Ambulatory Visit: Payer: Self-pay

## 2019-07-13 DIAGNOSIS — R918 Other nonspecific abnormal finding of lung field: Secondary | ICD-10-CM | POA: Diagnosis not present

## 2019-08-11 DIAGNOSIS — R918 Other nonspecific abnormal finding of lung field: Secondary | ICD-10-CM | POA: Diagnosis not present

## 2019-08-11 DIAGNOSIS — J439 Emphysema, unspecified: Secondary | ICD-10-CM | POA: Diagnosis not present

## 2019-08-11 DIAGNOSIS — I700 Atherosclerosis of aorta: Secondary | ICD-10-CM | POA: Diagnosis not present

## 2019-08-11 DIAGNOSIS — F17200 Nicotine dependence, unspecified, uncomplicated: Secondary | ICD-10-CM | POA: Diagnosis not present

## 2020-02-04 DIAGNOSIS — Z0000 Encounter for general adult medical examination without abnormal findings: Secondary | ICD-10-CM | POA: Diagnosis not present

## 2020-02-04 DIAGNOSIS — E7849 Other hyperlipidemia: Secondary | ICD-10-CM | POA: Diagnosis not present

## 2020-02-04 DIAGNOSIS — Z125 Encounter for screening for malignant neoplasm of prostate: Secondary | ICD-10-CM | POA: Diagnosis not present

## 2020-02-08 DIAGNOSIS — Z0000 Encounter for general adult medical examination without abnormal findings: Secondary | ICD-10-CM | POA: Diagnosis not present

## 2020-02-08 DIAGNOSIS — E785 Hyperlipidemia, unspecified: Secondary | ICD-10-CM | POA: Diagnosis not present

## 2020-04-14 DIAGNOSIS — J069 Acute upper respiratory infection, unspecified: Secondary | ICD-10-CM | POA: Diagnosis not present

## 2020-04-14 DIAGNOSIS — R059 Cough, unspecified: Secondary | ICD-10-CM | POA: Diagnosis not present

## 2020-04-14 DIAGNOSIS — Z1152 Encounter for screening for COVID-19: Secondary | ICD-10-CM | POA: Diagnosis not present

## 2020-04-30 ENCOUNTER — Ambulatory Visit: Payer: BC Managed Care – PPO | Attending: Internal Medicine

## 2020-04-30 DIAGNOSIS — Z23 Encounter for immunization: Secondary | ICD-10-CM

## 2020-04-30 NOTE — Progress Notes (Signed)
   Covid-19 Vaccination Clinic  Name:  Travis Woods    MRN: [id number] DOB: 01/13/1961  04/30/2020  Travis Woods was observed post Covid-19 immunization for 15 minutes without incident. He was provided with Vaccine Information Sheet and instruction to access the V-Safe system.   Travis Woods was instructed to call 911 with any severe reactions post vaccine: Marland Kitchen Difficulty breathing  . Swelling of face and throat  . A fast heartbeat  . A bad rash all over body  . Dizziness and weakness

## 2020-06-17 DIAGNOSIS — E785 Hyperlipidemia, unspecified: Secondary | ICD-10-CM | POA: Diagnosis not present

## 2020-08-11 DIAGNOSIS — H11153 Pinguecula, bilateral: Secondary | ICD-10-CM | POA: Diagnosis not present

## 2020-08-11 DIAGNOSIS — H40013 Open angle with borderline findings, low risk, bilateral: Secondary | ICD-10-CM | POA: Diagnosis not present

## 2020-08-11 DIAGNOSIS — H25813 Combined forms of age-related cataract, bilateral: Secondary | ICD-10-CM | POA: Diagnosis not present

## 2020-08-11 DIAGNOSIS — H04123 Dry eye syndrome of bilateral lacrimal glands: Secondary | ICD-10-CM | POA: Diagnosis not present

## 2020-12-01 ENCOUNTER — Encounter: Payer: Self-pay | Admitting: Internal Medicine

## 2020-12-15 ENCOUNTER — Encounter: Payer: Self-pay | Admitting: Internal Medicine

## 2021-02-22 ENCOUNTER — Ambulatory Visit (AMBULATORY_SURGERY_CENTER): Payer: BC Managed Care – PPO | Admitting: *Deleted

## 2021-02-22 ENCOUNTER — Other Ambulatory Visit: Payer: Self-pay

## 2021-02-22 VITALS — Ht 72.0 in | Wt 175.0 lb

## 2021-02-22 DIAGNOSIS — Z86010 Personal history of colonic polyps: Secondary | ICD-10-CM

## 2021-02-22 NOTE — Progress Notes (Signed)
Pt's previsit is done over the phone and all paperwork (prep instructions, blank consent form to just read over) sent to patient.  Pt's name and DOB verified at the beginning of the previsit.  Pt denies any difficulty with ambulating.     No trouble with anesthesia, denies trouble moving neck, or hx/fam hx of malignant hyperthermia per pt   No egg or soy allergy  No home oxygen use   No medications taken for weight loss

## 2021-02-24 DIAGNOSIS — E785 Hyperlipidemia, unspecified: Secondary | ICD-10-CM | POA: Diagnosis not present

## 2021-02-24 DIAGNOSIS — Z125 Encounter for screening for malignant neoplasm of prostate: Secondary | ICD-10-CM | POA: Diagnosis not present

## 2021-02-27 DIAGNOSIS — R82998 Other abnormal findings in urine: Secondary | ICD-10-CM | POA: Diagnosis not present

## 2021-02-27 DIAGNOSIS — L930 Discoid lupus erythematosus: Secondary | ICD-10-CM | POA: Diagnosis not present

## 2021-02-27 DIAGNOSIS — Z1212 Encounter for screening for malignant neoplasm of rectum: Secondary | ICD-10-CM | POA: Diagnosis not present

## 2021-02-27 DIAGNOSIS — Z0000 Encounter for general adult medical examination without abnormal findings: Secondary | ICD-10-CM | POA: Diagnosis not present

## 2021-02-28 ENCOUNTER — Other Ambulatory Visit: Payer: Self-pay | Admitting: Internal Medicine

## 2021-02-28 DIAGNOSIS — R918 Other nonspecific abnormal finding of lung field: Secondary | ICD-10-CM

## 2021-03-08 ENCOUNTER — Encounter: Payer: BC Managed Care – PPO | Admitting: Internal Medicine

## 2021-03-20 ENCOUNTER — Ambulatory Visit
Admission: RE | Admit: 2021-03-20 | Discharge: 2021-03-20 | Disposition: A | Discharge status: Home / Self Care | Payer: BC Managed Care – PPO | Source: Ambulatory Visit | Attending: Internal Medicine | Admitting: Internal Medicine

## 2021-03-20 DIAGNOSIS — R911 Solitary pulmonary nodule: Secondary | ICD-10-CM | POA: Diagnosis not present

## 2021-03-20 DIAGNOSIS — R918 Other nonspecific abnormal finding of lung field: Secondary | ICD-10-CM

## 2021-03-20 DIAGNOSIS — J439 Emphysema, unspecified: Secondary | ICD-10-CM | POA: Diagnosis not present

## 2021-03-20 DIAGNOSIS — I700 Atherosclerosis of aorta: Secondary | ICD-10-CM | POA: Diagnosis not present

## 2021-03-28 ENCOUNTER — Encounter: Payer: Self-pay | Admitting: Internal Medicine

## 2021-04-10 ENCOUNTER — Other Ambulatory Visit: Payer: Self-pay

## 2021-04-10 ENCOUNTER — Encounter: Payer: Self-pay | Admitting: Internal Medicine

## 2021-04-10 ENCOUNTER — Ambulatory Visit (AMBULATORY_SURGERY_CENTER): Payer: BC Managed Care – PPO | Admitting: Internal Medicine

## 2021-04-10 VITALS — BP 119/72 | HR 55 | Temp 97.8°F | Resp 17 | Ht 72.0 in | Wt 175.0 lb

## 2021-04-10 DIAGNOSIS — Z86010 Personal history of colonic polyps: Secondary | ICD-10-CM | POA: Diagnosis not present

## 2021-04-10 DIAGNOSIS — Z1211 Encounter for screening for malignant neoplasm of colon: Secondary | ICD-10-CM | POA: Diagnosis not present

## 2021-04-10 MED ORDER — SODIUM CHLORIDE 0.9 % IV SOLN: 500.0000 mL | Freq: Once | INTRAVENOUS | Status: DC

## 2021-04-10 NOTE — Progress Notes (Signed)
Pt's states no medical or surgical changes since previsit or office visit.  Vitals by DT 

## 2021-04-10 NOTE — Progress Notes (Signed)
Report to PACU, RN, vss, BBS= Clear.  

## 2021-04-10 NOTE — Progress Notes (Signed)
[facility] History and Physical   Primary Care Physician:  Prince Solian, MD   Reason for Procedure:   Hx colon polyps  Plan:    colonoscopy     HPI: Travis Woods is a 60 y.o. male here for a polyp surveillance colonoscopy   Past Medical History:  Diagnosis Date   Cataract    forming   Emphysema of lung ([place])    Hyperlipidemia    Personal history of adenomatous colonic polyps 04/16/2012   04/2012 - 3 polyps, 2 adenomas largest 12 mm and 1 hyperplastic    Sleep apnea     Past Surgical History:  Procedure Laterality Date   COLONOSCOPY      Prior to Admission medications   Medication Sig Start Date End Date Taking? Authorizing Provider  HYDROcodone-acetaminophen (NORCO/VICODIN) 5-325 MG tablet Take 1-2 tablets by mouth every 6 (six) hours as needed for severe pain. 06/01/18   Joy, Shawn C, PA-C  ibuprofen (ADVIL,MOTRIN) 200 MG tablet Take 400 mg by mouth every 6 (six) hours as needed for moderate pain.    [provider]  lidocaine (LIDODERM) 5 % Place 1 patch onto the skin daily. Remove & Discard patch within 12 hours or as directed by MD Patient not taking: Reported on 04/10/2021 06/01/18   Joy, Helane Gunther, PA-C  meloxicam (MOBIC) 15 MG tablet Take 15 mg by mouth daily. 02/03/21   [provider]  methocarbamol (ROBAXIN) 500 MG tablet Take 1 tablet (500 mg total) by mouth 2 (two) times daily. 06/01/18   Joy, Shawn C, PA-C  rosuvastatin (CRESTOR) 5 MG tablet Take 5 mg by mouth daily. 10/21/20   [provider]  Varenicline Tartrate (CHANTIX PO) Take by mouth. Patient not taking: No sig reported    [provider]    Current Outpatient Medications  Medication Sig Dispense Refill   HYDROcodone-acetaminophen (NORCO/VICODIN) 5-325 MG tablet Take 1-2 tablets by mouth every 6 (six) hours as needed for severe pain. 10 tablet 0   ibuprofen (ADVIL,MOTRIN) 200 MG tablet Take 400 mg by mouth every 6 (six) hours as needed for moderate  pain.     lidocaine (LIDODERM) 5 % Place 1 patch onto the skin daily. Remove & Discard patch within 12 hours or as directed by MD (Patient not taking: Reported on 04/10/2021) 30 patch 0   meloxicam (MOBIC) 15 MG tablet Take 15 mg by mouth daily.     methocarbamol (ROBAXIN) 500 MG tablet Take 1 tablet (500 mg total) by mouth 2 (two) times daily. 20 tablet 0   rosuvastatin (CRESTOR) 5 MG tablet Take 5 mg by mouth daily.     Varenicline Tartrate (CHANTIX PO) Take by mouth. (Patient not taking: No sig reported)     Current Facility-Administered Medications  Medication Dose Route Frequency Provider Last Rate Last Admin   0.9 %  sodium chloride infusion  500 mL Intravenous Once Gatha Mayer, MD        Allergies as of 04/10/2021   (No Known Allergies)    Family History  Problem Relation Age of Onset   Prostate cancer Father    Colon cancer Neg Hx    Esophageal cancer Neg Hx    Rectal cancer Neg Hx    Stomach cancer Neg Hx     Social History   Socioeconomic History   Marital status: Married  Tobacco Use   Smoking status: Every Day    Types: Cigars   Smokeless tobacco: Never  Vaping Use   Vaping Use: Never used  Substance and Sexual Activity   Alcohol use: No    Alcohol/week: 0.0 standard drinks   Drug use: Never     Review of Systems:  All other review of systems negative except as mentioned in the HPI.  Physical Exam: Vital signs BP 129/81   Pulse 64   Temp 97.8 F (36.6 C)   Resp 13   Ht 6' (1.829 m)   Wt 175 lb (79.4 kg)   SpO2 100%   BMI 23.73 kg/m   General:   Alert,  Well-developed, well-nourished, pleasant and cooperative in NAD Lungs:  Clear throughout to auscultation.   Heart:  Regular rate and rhythm; no murmurs, clicks, rubs,  or gallops. Abdomen:  Soft, nontender and nondistended. Normal bowel sounds.   Neuro/Psych:  Alert and cooperative. Normal mood and affect. A and O x 3   @Loretta Kluender  Simonne Maffucci, MD, [facility]  Gastroenterology [phone] (pager) 04/10/2021 9:17 AM@

## 2021-04-10 NOTE — Op Note (Signed)
[facility] Patient Name: Travis Woods Procedure Date: 04/10/2021 9:15 AM MRN: [id number] Endoscopist: Gatha Mayer , MD Age: 60 Referring MD:  Date of Birth: [birth year] Gender: Male Account #: [id number] Procedure:                Colonoscopy Indications:              Surveillance: Personal history of adenomatous                            polyps on last colonoscopy 5 years ago Medicines:                Propofol per Anesthesia, Monitored Anesthesia Care Procedure:                Pre-Anesthesia Assessment:                           - Prior to the procedure, a History and Physical                            was performed, and patient medications and                            allergies were reviewed. The patient's tolerance of                            previous anesthesia was also reviewed. The risks                            and benefits of the procedure and the sedation                            options and risks were discussed with the patient.                            All questions were answered, and informed consent                            was obtained. Prior Anticoagulants: The patient has                            taken no previous anticoagulant or antiplatelet                            agents. ASA Grade Assessment: II - A patient with                            mild systemic disease. After reviewing the risks                            and benefits, the patient was deemed in                            satisfactory condition to undergo the procedure.  After obtaining informed consent, the colonoscope                            was passed under direct vision. Throughout the                            procedure, the patient's blood pressure, pulse, and                            oxygen saturations were monitored continuously. The                            CF [id number] #[id number] was introduced through the anus                             and advanced to the the cecum, identified by                            appendiceal orifice and ileocecal valve. The                            colonoscopy was somewhat difficult due to                            significant looping. Successful completion of the                            procedure was aided by straightening and shortening                            the scope to obtain bowel loop reduction. The                            patient tolerated the procedure well. The quality                            of the bowel preparation was good. The ileocecal                            valve, appendiceal orifice, and rectum were                            photographed. The bowel preparation used was                            Miralax via split dose instruction. Scope In: 9:26:38 AM Scope Out: 9:45:07 AM Scope Withdrawal Time: 0 hours 12 minutes 6 seconds  Total Procedure Duration: 0 hours 18 minutes 29 seconds  Findings:                 The perianal and digital rectal examinations were                            normal. Pertinent negatives include normal prostate                            (  size, shape, and consistency).                           External and internal hemorrhoids were found.                           The exam was otherwise without abnormality on                            direct and retroflexion views. Complications:            No immediate complications. Estimated Blood Loss:     Estimated blood loss: none. Impression:               - External and internal hemorrhoids.                           - The examination was otherwise normal on direct                            and retroflexion views.                           - No specimens collected.                           - Personal history of colonic polyps. 04/2012 - 3                            polyps, 2 adenomas largest 12 mm and 1 hyperplastic                           08/26/2015 3 mm sigmoid polyp removed - not  recovered Recommendation:           - Patient has a contact number available for                            emergencies. The signs and symptoms of potential                            delayed complications were discussed with the                            patient. Return to normal activities tomorrow.                            Written discharge instructions were provided to the                            patient.                           - Resume previous diet.                           - Continue present medications.                           -  Repeat colonoscopy in 5 years for surveillance. Gatha Mayer, MD 04/10/2021 9:53:30 AM This report has been signed electronically.

## 2021-04-10 NOTE — Patient Instructions (Addendum)
No polyps today.  Your next routine colonoscopy should be in 5 years - 2027.  I appreciate the opportunity to care for you. Gatha Mayer, MD, FACG    YOU HAD AN ENDOSCOPIC PROCEDURE TODAY AT [facility]:   Refer to the procedure report that was given to you for any specific questions about what was found during the examination.  If the procedure report does not answer your questions, please call your gastroenterologist to clarify.  If you requested that your care partner not be given the details of your procedure findings, then the procedure report has been included in a sealed envelope for you to review at your convenience later.  YOU SHOULD EXPECT: Some feelings of bloating in the abdomen. Passage of more gas than usual.  Walking can help get rid of the air that was put into your GI tract during the procedure and reduce the bloating. If you had a lower endoscopy (such as a colonoscopy or flexible sigmoidoscopy) you may notice spotting of blood in your stool or on the toilet paper. If you underwent a bowel prep for your procedure, you may not have a normal bowel movement for a few days.  Please Note:  You might notice some irritation and congestion in your nose or some drainage.  This is from the oxygen used during your procedure.  There is no need for concern and it should clear up in a day or so.  SYMPTOMS TO REPORT IMMEDIATELY:  Following lower endoscopy (colonoscopy or flexible sigmoidoscopy):  Excessive amounts of blood in the stool  Significant tenderness or worsening of abdominal pains  Swelling of the abdomen that is new, acute  Fever of 100F or higher  For urgent or emergent issues, a gastroenterologist can be reached at any hour by calling [phone]. Do not use MyChart messaging for urgent concerns.    DIET:  We do recommend a small meal at first, but then you may proceed to your regular diet.  Drink plenty of fluids but you should avoid alcoholic  beverages for 24 hours.  ACTIVITY:  You should plan to take it easy for the rest of today and you should NOT DRIVE or use heavy machinery until tomorrow (because of the sedation medicines used during the test).    FOLLOW UP: Our staff will call the number listed on your records 48-72 hours following your procedure to check on you and address any questions or concerns that you may have regarding the information given to you following your procedure. If we do not reach you, we will leave a message.  We will attempt to reach you two times.  During this call, we will ask if you have developed any symptoms of COVID 19. If you develop any symptoms (ie: fever, flu-like symptoms, shortness of breath, cough etc.) before then, please call [phone].  If you test positive for Covid 19 in the 2 weeks post procedure, please call and report this information to [place].    SIGNATURES/CONFIDENTIALITY: You and/or your care partner have signed paperwork which will be entered into your electronic medical record.  These signatures attest to the fact that that the information above on your After Visit Summary has been reviewed and is understood.  Full responsibility of the confidentiality of this discharge information lies with you and/or your care-partner.

## 2021-04-12 ENCOUNTER — Telehealth: Payer: Self-pay

## 2021-04-12 NOTE — Telephone Encounter (Signed)
  Follow up Call-  Call back number 04/10/2021  Post procedure Call Back phone  # [phone]  Some recent data might be hidden     Attempted to call patient, patient hung up after answering

## 2021-04-12 NOTE — Telephone Encounter (Signed)
Attempted to reach patient for post-procedure f/u call. No answer. Left message that staff will make another attempt to reach him later today and for him to please not hesitate to call us if he has any questions/concerns regarding his care. 

## 2021-05-28 IMAGING — CT CT CHEST W/O CM
1 series · 15 of 34 positions shown, 19 images · non-contrast
Comparison: CT scan of the chest dated 06/01/2018

CLINICAL DATA: Follow-up of pulmonary nodules.

EXAM:
CT CHEST WITHOUT CONTRAST
TECHNIQUE: Multidetector CT imaging of the chest was performed following the
standard protocol without IV contrast.

[Series 2: chest w/(date) · axial · 0.66mm/px · z∈[-316,-40]mm · 15 of 164 slices shown, 19 images]
[im 13/164  mediastinal]
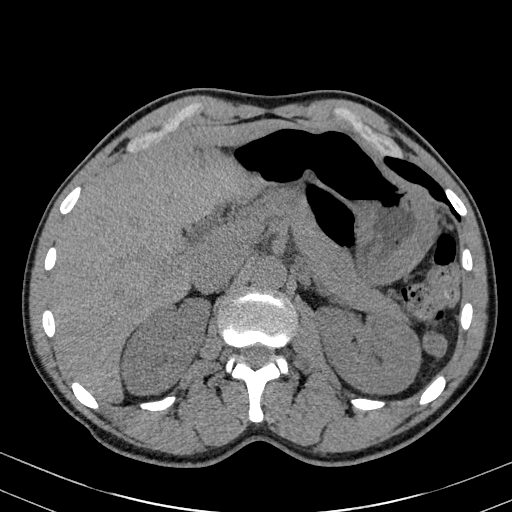
[im 13/164  lung]
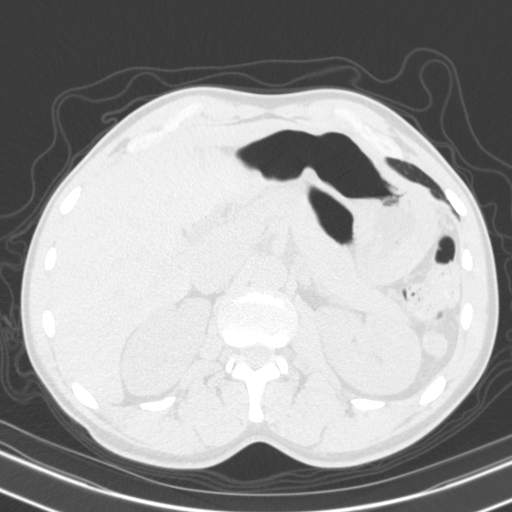
[im 25/164  lung]
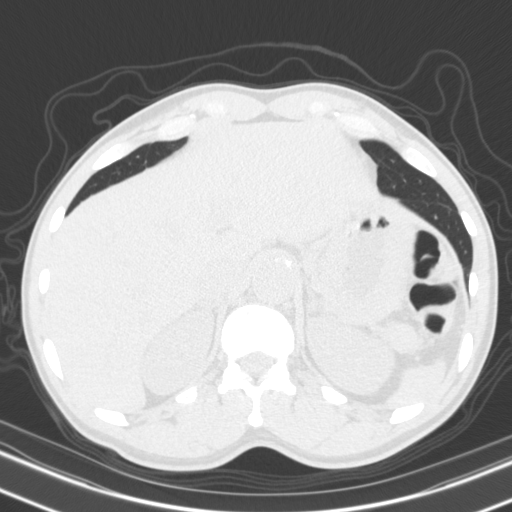
[im 33/164  lung]
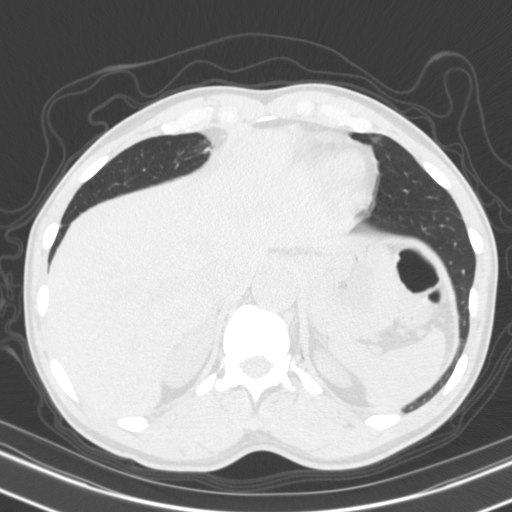
[im 43/164  lung]
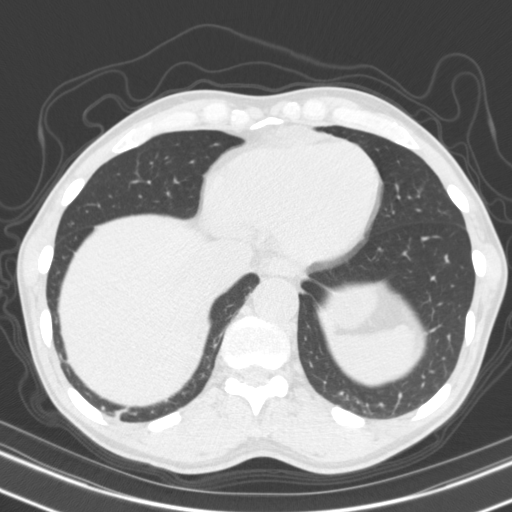
[im 55/164  mediastinal]
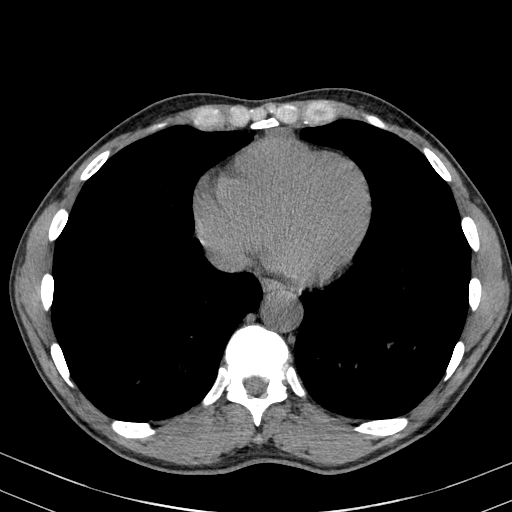
[im 55/164  lung]
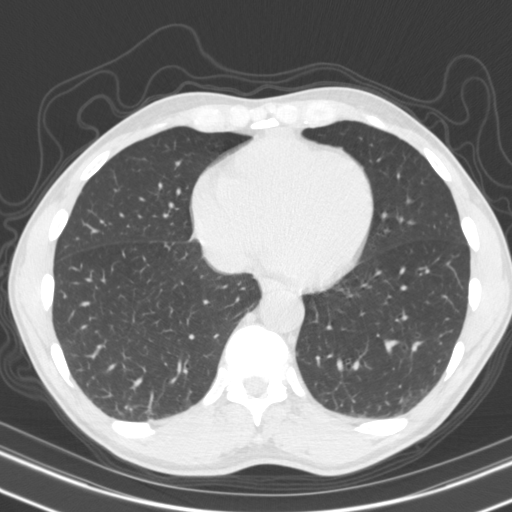
[im 66/164  lung]
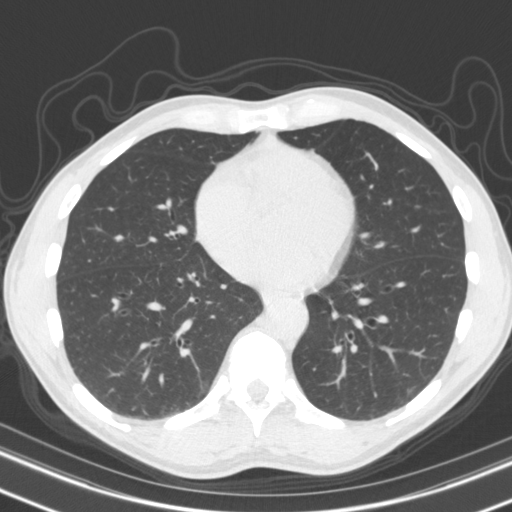
[im 73/164  lung]
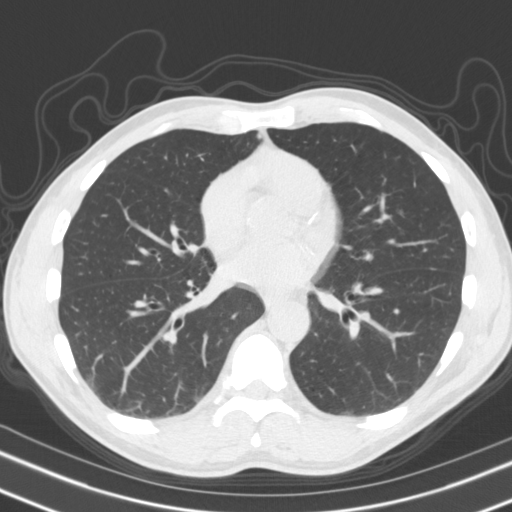
[im 85/164  lung]
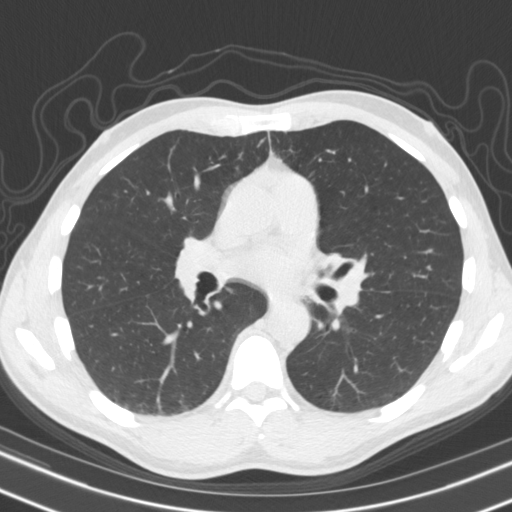
[im 91/164  mediastinal]
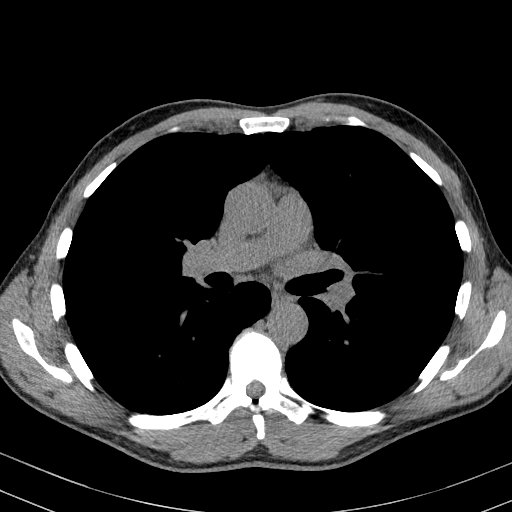
[im 91/164  lung]
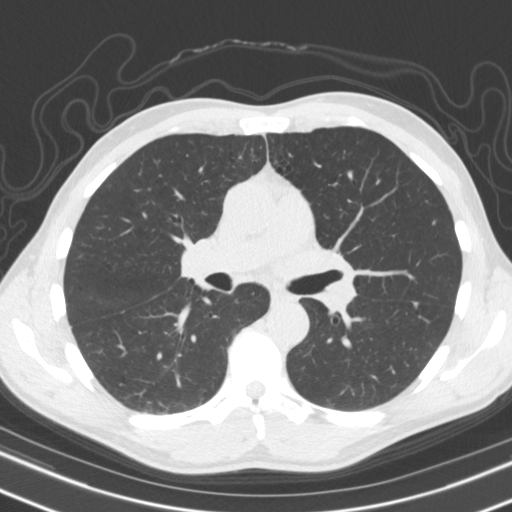
[im 98/164  lung]
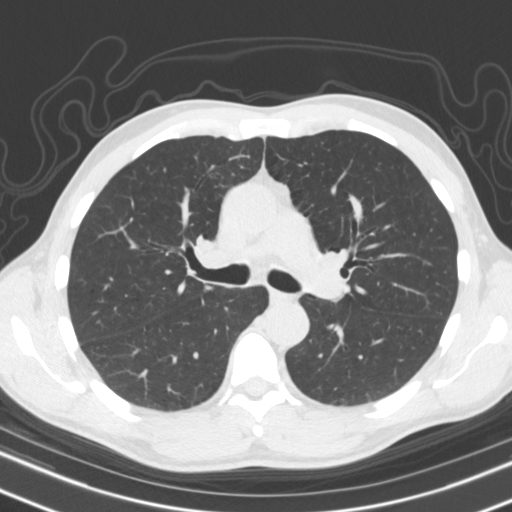
[im 109/164  lung]
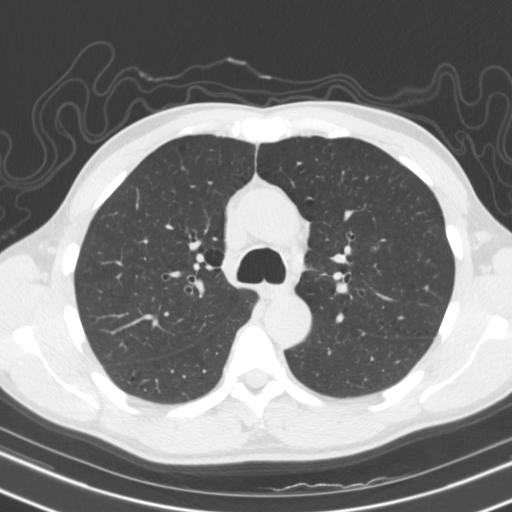
[im 121/164  lung]
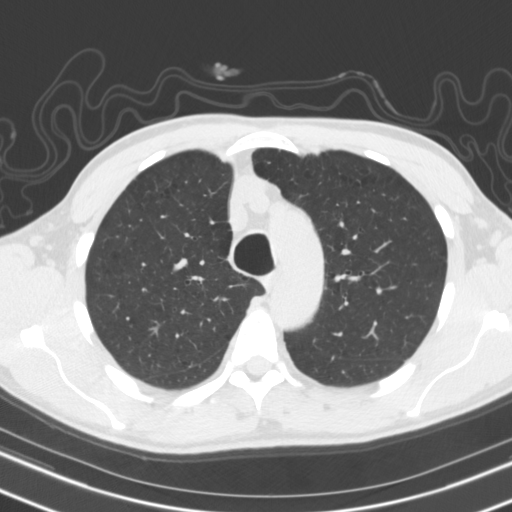
[im 131/164  mediastinal]
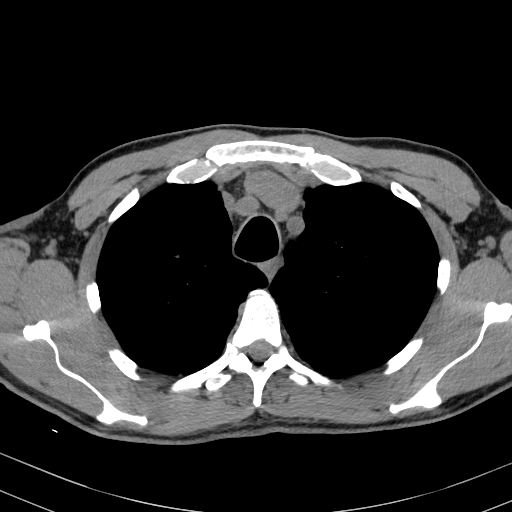
[im 131/164  lung]
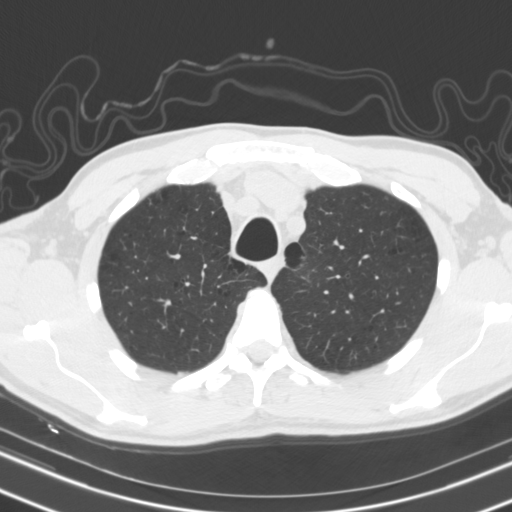
[im 139/164  lung]
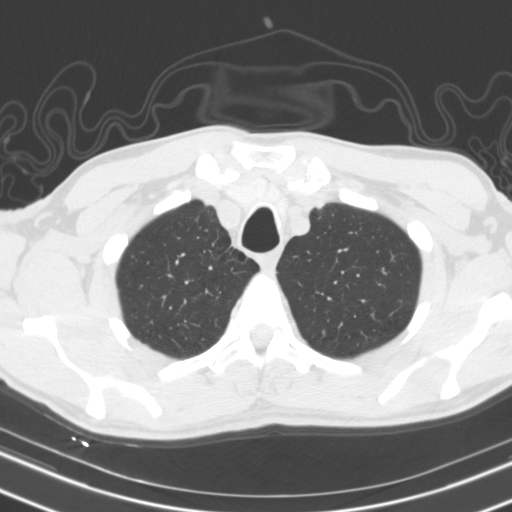
[im 151/164  lung]
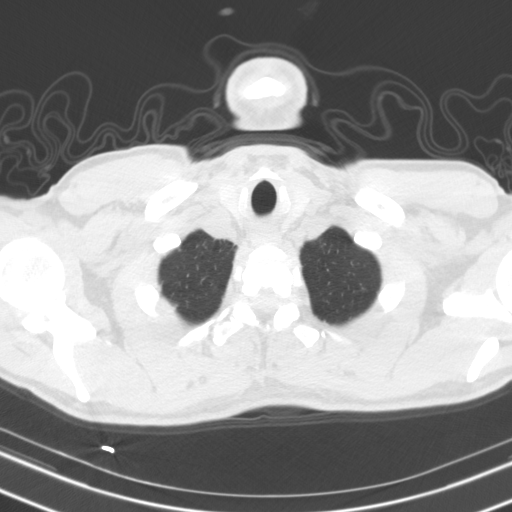

[15 of 34 positions shown; findings below may reference images not displayed]

FINDINGS: Cardiovascular: Aortic atherosclerosis. Coronary artery
calcifications. Heart size is normal. No significant pericardial
effusion.

Mediastinum/Nodes: No enlarged mediastinal or axillary lymph nodes.
Thyroid gland, trachea, and esophagus demonstrate no significant
findings.

Lungs/Pleura: There is a 3 mm nodule in the posterolateral aspect of
the right upper lobe seen on image 50 of series 3, unchanged since
the prior study. There are slight emphysematous changes in both
upper lobes which are more apparent on the current exam. No
effusions. Slight linear scarring at the right lung base
posteriorly.

Upper Abdomen: Normal.

Musculoskeletal: No chest wall mass or suspicious bone lesions
identified.
IMPRESSION: Stable 3 mm nodule in the posterolateral aspect of the right upper
lobe. No further follow-up is recommended.

Aortic Atherosclerosis (TSF8R-WV2.2) and Emphysema (TSF8R-KM1.8).

## 2021-06-21 DIAGNOSIS — M25552 Pain in left hip: Secondary | ICD-10-CM | POA: Diagnosis not present

## 2021-08-18 DIAGNOSIS — M545 Low back pain, unspecified: Secondary | ICD-10-CM | POA: Diagnosis not present

## 2021-08-18 DIAGNOSIS — M47896 Other spondylosis, lumbar region: Secondary | ICD-10-CM | POA: Diagnosis not present

## 2021-08-18 DIAGNOSIS — M5136 Other intervertebral disc degeneration, lumbar region: Secondary | ICD-10-CM | POA: Diagnosis not present

## 2021-08-18 DIAGNOSIS — M47816 Spondylosis without myelopathy or radiculopathy, lumbar region: Secondary | ICD-10-CM | POA: Diagnosis not present

## 2021-08-24 DIAGNOSIS — M545 Low back pain, unspecified: Secondary | ICD-10-CM | POA: Diagnosis not present

## 2021-08-24 DIAGNOSIS — M25551 Pain in right hip: Secondary | ICD-10-CM | POA: Diagnosis not present

## 2021-09-07 DIAGNOSIS — M1611 Unilateral primary osteoarthritis, right hip: Secondary | ICD-10-CM | POA: Diagnosis not present

## 2021-09-07 DIAGNOSIS — M25551 Pain in right hip: Secondary | ICD-10-CM | POA: Diagnosis not present

## 2021-09-20 DIAGNOSIS — M25551 Pain in right hip: Secondary | ICD-10-CM | POA: Diagnosis not present

## 2021-10-11 DIAGNOSIS — M5451 Vertebrogenic low back pain: Secondary | ICD-10-CM | POA: Diagnosis not present

## 2022-01-16 DIAGNOSIS — M5416 Radiculopathy, lumbar region: Secondary | ICD-10-CM | POA: Diagnosis not present

## 2022-01-30 DIAGNOSIS — M5459 Other low back pain: Secondary | ICD-10-CM | POA: Diagnosis not present

## 2022-01-30 DIAGNOSIS — M5451 Vertebrogenic low back pain: Secondary | ICD-10-CM | POA: Diagnosis not present

## 2022-03-08 DIAGNOSIS — H04123 Dry eye syndrome of bilateral lacrimal glands: Secondary | ICD-10-CM | POA: Diagnosis not present

## 2022-03-08 DIAGNOSIS — H25813 Combined forms of age-related cataract, bilateral: Secondary | ICD-10-CM | POA: Diagnosis not present

## 2022-03-08 DIAGNOSIS — H11153 Pinguecula, bilateral: Secondary | ICD-10-CM | POA: Diagnosis not present

## 2022-03-08 DIAGNOSIS — H40013 Open angle with borderline findings, low risk, bilateral: Secondary | ICD-10-CM | POA: Diagnosis not present

## 2023-02-03 IMAGING — CT CT CHEST W/O CM
2 of 4 series · 12 of 36 positions shown, 15 images · non-contrast
Comparison: [DATE] [DATE], [DATE].  [DATE] [DATE], [DATE].

CLINICAL DATA: Abnormal finding of lung fields.

EXAM:
CT CHEST WITHOUT CONTRAST
TECHNIQUE: Multidetector CT imaging of the chest was performed following the
standard protocol without IV contrast.

[Series 2: chest 2.00 br40 s3 · axial · 0.53mm/px · z∈[+1549,+1846]mm · 9 of 177 slices shown, 12 images (1 of 2)]
[im 14/177  mediastinal]
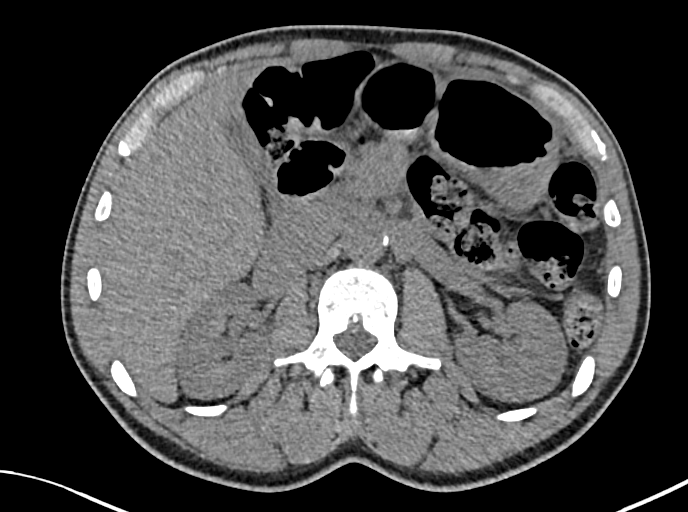
[im 14/177  lung]
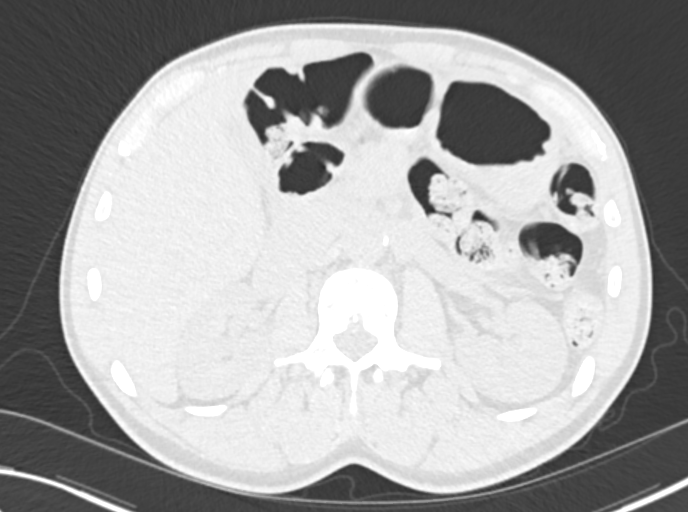
[im 41/177  lung]
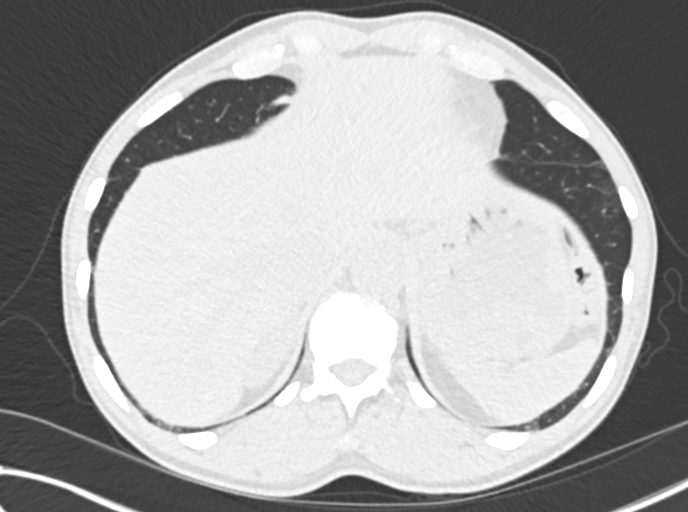
[im 55/177  lung]
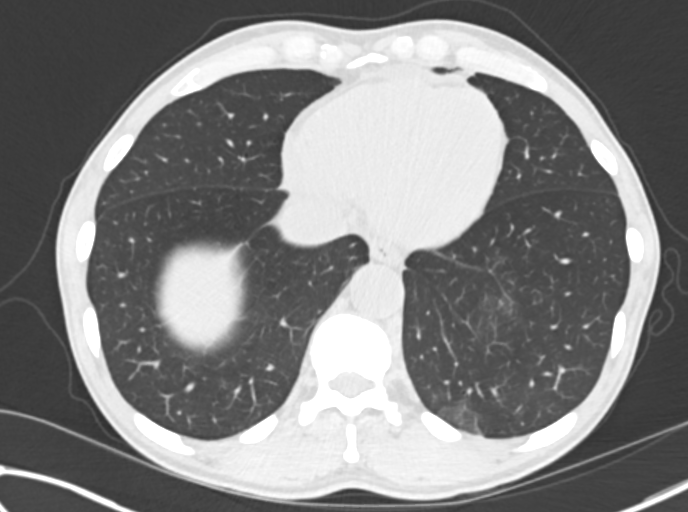
[im 68/177  lung]
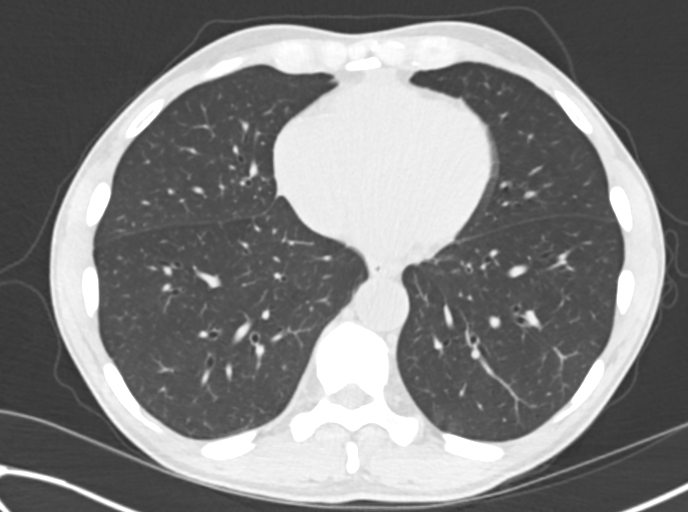
[im 95/177  mediastinal]
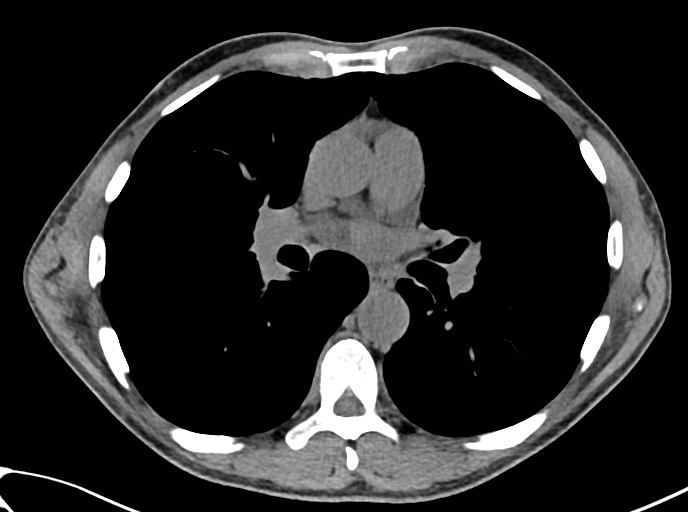
[im 95/177  lung]
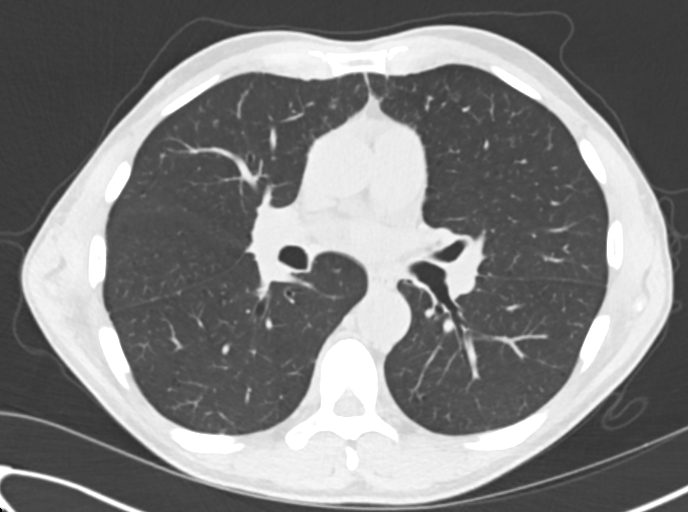
[im 109/177  lung]
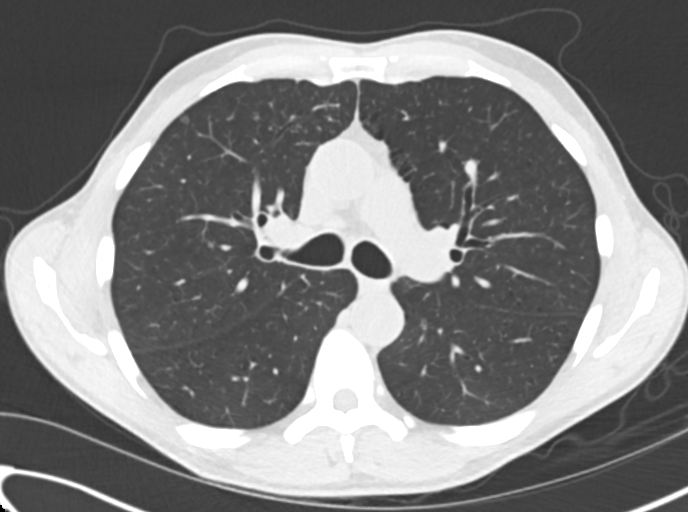
[im 122/177  lung]
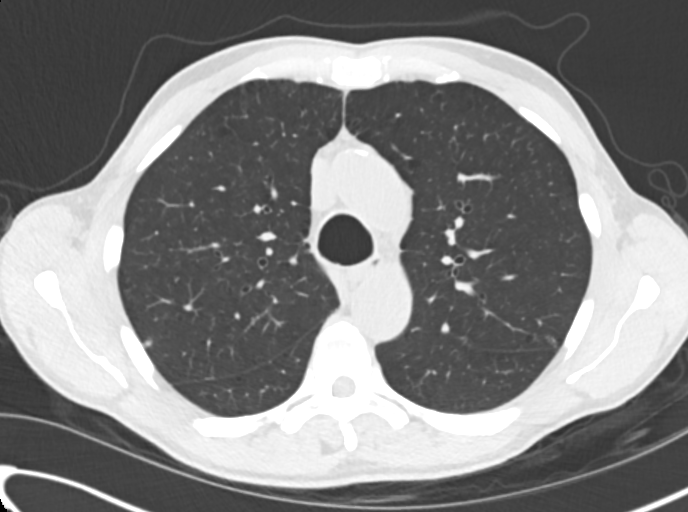
[im 149/177  lung]
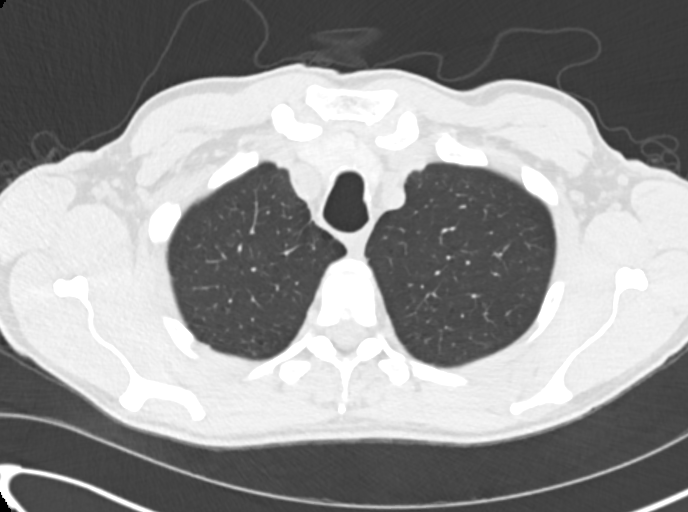
[im 163/177  mediastinal]
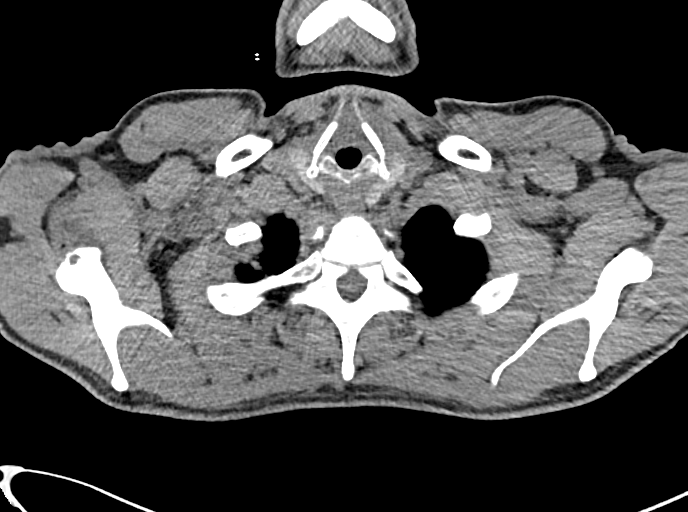
[im 163/177  lung]
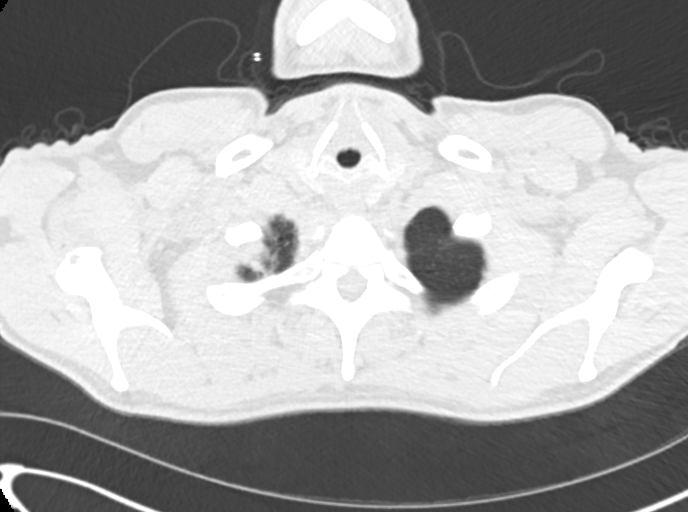

[Series 4: chest 2.00 br40 s3 · coronal · 0.69mm/px · 3 of 135 slices shown (2 of 2)]
[im 27/135  lung]
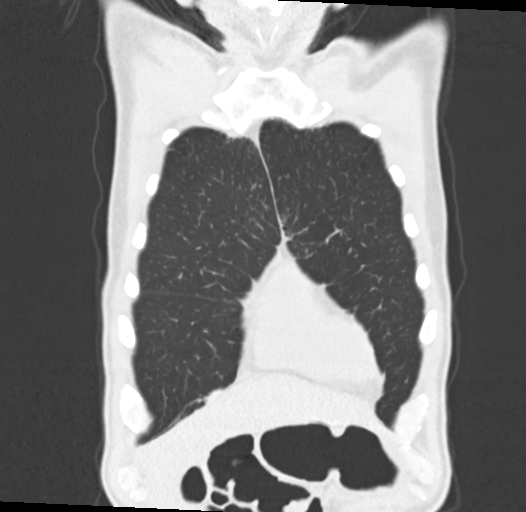
[im 54/135  lung]
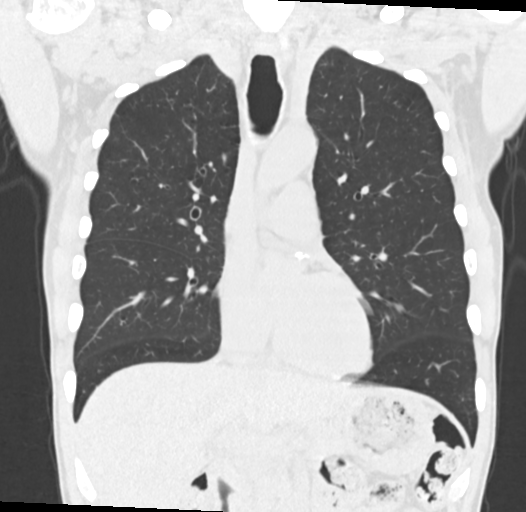
[im 81/135  lung]
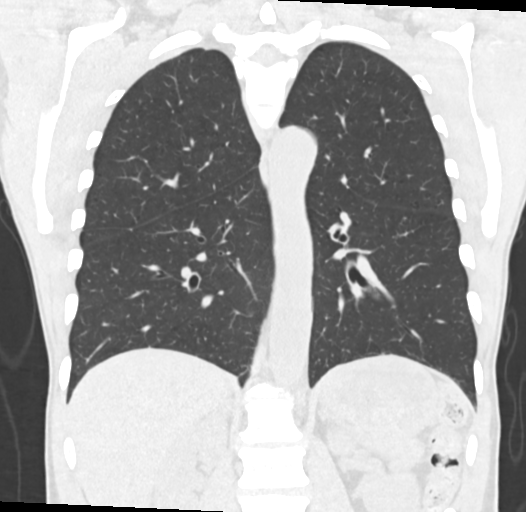

[12 of 36 positions shown; findings below may reference images not displayed]

FINDINGS: Cardiovascular: Atherosclerosis of thoracic aorta is noted without
aneurysm formation. Normal cardiac size. No pericardial effusion.
Mild coronary artery calcifications are noted.

Mediastinum/Nodes: No enlarged mediastinal or axillary lymph nodes.
Thyroid gland, trachea, and esophagus demonstrate no significant
findings.

Lungs/Pleura: No pneumothorax or pleural effusion is noted.
Emphysematous disease is noted bilaterally. Mild biapical scarring
is noted. Stable 3 mm nodule is noted in left upper lobe. Stable 7
mm nodular density is seen in right upper lobe adjacent to minor
fissure most consistent with intrapulmonary lymph node. Stable 3 mm
nodule is noted posteriorly in the right upper lobe best seen on
image number 57 of series 8. No new nodules are noted. No
consolidative process is noted.

Upper Abdomen: No acute abnormality.

Musculoskeletal: No chest wall mass or suspicious bone lesions
identified.
IMPRESSION: Stable bilateral pulmonary nodules are noted compared to the prior
exam, including probable intrapulmonary lymph node in right upper
lobe. These can be considered benign at this point with no further
follow-up required.

Mild coronary calcifications are noted.

Aortic Atherosclerosis (0742B-58L.L) and Emphysema (0742B-[id number].V).

## 2023-05-10 DIAGNOSIS — J069 Acute upper respiratory infection, unspecified: Secondary | ICD-10-CM | POA: Diagnosis not present

## 2023-05-10 DIAGNOSIS — H6123 Impacted cerumen, bilateral: Secondary | ICD-10-CM | POA: Diagnosis not present

## 2023-06-06 DIAGNOSIS — H04123 Dry eye syndrome of bilateral lacrimal glands: Secondary | ICD-10-CM | POA: Diagnosis not present

## 2023-06-06 DIAGNOSIS — H11153 Pinguecula, bilateral: Secondary | ICD-10-CM | POA: Diagnosis not present

## 2023-06-06 DIAGNOSIS — H40013 Open angle with borderline findings, low risk, bilateral: Secondary | ICD-10-CM | POA: Diagnosis not present

## 2023-06-06 DIAGNOSIS — H25813 Combined forms of age-related cataract, bilateral: Secondary | ICD-10-CM | POA: Diagnosis not present
